# Patient Record
Sex: Female | Born: 1955
Health system: Southern US, Community
[De-identification: ages and names within clinical notes are randomized; demographics above are authoritative.]

## PROBLEM LIST (undated history)

## (undated) DIAGNOSIS — F419 Anxiety disorder, unspecified: Secondary | ICD-10-CM

## (undated) DIAGNOSIS — F319 Bipolar disorder, unspecified: Secondary | ICD-10-CM

## (undated) DIAGNOSIS — E119 Type 2 diabetes mellitus without complications: Secondary | ICD-10-CM

## (undated) DIAGNOSIS — Z860100 Personal history of colon polyps, unspecified: Secondary | ICD-10-CM

## (undated) DIAGNOSIS — Z8489 Family history of other specified conditions: Secondary | ICD-10-CM

## (undated) DIAGNOSIS — R0683 Snoring: Secondary | ICD-10-CM

## (undated) DIAGNOSIS — K589 Irritable bowel syndrome without diarrhea: Secondary | ICD-10-CM

## (undated) DIAGNOSIS — N6019 Diffuse cystic mastopathy of unspecified breast: Secondary | ICD-10-CM

## (undated) DIAGNOSIS — R0600 Dyspnea, unspecified: Secondary | ICD-10-CM

## (undated) DIAGNOSIS — T8859XA Other complications of anesthesia, initial encounter: Secondary | ICD-10-CM

## (undated) DIAGNOSIS — Z8619 Personal history of other infectious and parasitic diseases: Secondary | ICD-10-CM

## (undated) DIAGNOSIS — J449 Chronic obstructive pulmonary disease, unspecified: Secondary | ICD-10-CM

## (undated) DIAGNOSIS — E039 Hypothyroidism, unspecified: Secondary | ICD-10-CM

## (undated) DIAGNOSIS — F32A Depression, unspecified: Secondary | ICD-10-CM

## (undated) DIAGNOSIS — C801 Malignant (primary) neoplasm, unspecified: Secondary | ICD-10-CM

## (undated) DIAGNOSIS — F329 Major depressive disorder, single episode, unspecified: Secondary | ICD-10-CM

## (undated) DIAGNOSIS — E049 Nontoxic goiter, unspecified: Secondary | ICD-10-CM

## (undated) DIAGNOSIS — R32 Unspecified urinary incontinence: Secondary | ICD-10-CM

## (undated) DIAGNOSIS — Z8719 Personal history of other diseases of the digestive system: Secondary | ICD-10-CM

## (undated) DIAGNOSIS — M1990 Unspecified osteoarthritis, unspecified site: Secondary | ICD-10-CM

## (undated) DIAGNOSIS — E010 Iodine-deficiency related diffuse (endemic) goiter: Secondary | ICD-10-CM

## (undated) DIAGNOSIS — J45909 Unspecified asthma, uncomplicated: Secondary | ICD-10-CM

## (undated) DIAGNOSIS — K219 Gastro-esophageal reflux disease without esophagitis: Secondary | ICD-10-CM

## (undated) DIAGNOSIS — M92219 Osteochondrosis (juvenile) of carpal lunate [Kienbock], unspecified hand: Secondary | ICD-10-CM

## (undated) DIAGNOSIS — K5900 Constipation, unspecified: Secondary | ICD-10-CM

## (undated) HISTORY — PX: BREAST SURGERY: SHX581

## (undated) HISTORY — PX: TONSILLECTOMY: SUR1361

## (undated) HISTORY — PX: OTHER SURGICAL HISTORY: SHX169

## (undated) HISTORY — PX: TUBAL LIGATION: SHX77

## (undated) HISTORY — PX: SHOULDER ARTHROSCOPY: SHX128

---

## 2005-03-27 ENCOUNTER — Ambulatory Visit: Payer: Self-pay | Admitting: Unknown Physician Specialty

## 2005-05-06 ENCOUNTER — Encounter: Payer: Self-pay | Admitting: Unknown Physician Specialty

## 2005-06-01 ENCOUNTER — Encounter: Payer: Self-pay | Admitting: Unknown Physician Specialty

## 2005-07-02 ENCOUNTER — Encounter: Payer: Self-pay | Admitting: Unknown Physician Specialty

## 2005-07-30 ENCOUNTER — Encounter: Payer: Self-pay | Admitting: Unknown Physician Specialty

## 2006-10-21 ENCOUNTER — Encounter: Payer: Self-pay | Admitting: Urology

## 2006-10-31 ENCOUNTER — Encounter: Payer: Self-pay | Admitting: Urology

## 2006-11-11 ENCOUNTER — Ambulatory Visit: Payer: Self-pay | Admitting: Gastroenterology

## 2007-10-17 ENCOUNTER — Emergency Department: Payer: Self-pay | Admitting: Emergency Medicine

## 2008-06-25 ENCOUNTER — Ambulatory Visit: Payer: Self-pay | Admitting: Unknown Physician Specialty

## 2008-10-23 ENCOUNTER — Ambulatory Visit: Payer: Self-pay | Admitting: Unknown Physician Specialty

## 2008-10-31 ENCOUNTER — Ambulatory Visit: Payer: Self-pay | Admitting: Unknown Physician Specialty

## 2009-12-16 ENCOUNTER — Emergency Department: Payer: Self-pay | Admitting: Emergency Medicine

## 2010-01-17 ENCOUNTER — Emergency Department: Payer: Self-pay | Admitting: Emergency Medicine

## 2010-02-27 ENCOUNTER — Ambulatory Visit: Payer: Self-pay | Admitting: Obstetrics and Gynecology

## 2010-03-07 ENCOUNTER — Ambulatory Visit: Payer: Self-pay | Admitting: Obstetrics and Gynecology

## 2012-02-15 ENCOUNTER — Ambulatory Visit: Payer: Self-pay | Admitting: Gastroenterology

## 2012-02-24 ENCOUNTER — Ambulatory Visit: Payer: Self-pay | Admitting: Gastroenterology

## 2015-06-02 DIAGNOSIS — Z8614 Personal history of Methicillin resistant Staphylococcus aureus infection: Secondary | ICD-10-CM

## 2015-06-02 HISTORY — DX: Personal history of Methicillin resistant Staphylococcus aureus infection: Z86.14

## 2016-04-08 HISTORY — PX: JOINT REPLACEMENT: SHX530

## 2016-11-25 ENCOUNTER — Telehealth: Payer: Self-pay | Admitting: *Deleted

## 2016-11-25 DIAGNOSIS — Z87891 Personal history of nicotine dependence: Secondary | ICD-10-CM

## 2016-11-25 NOTE — Telephone Encounter (Signed)
Received referral for initial lung cancer screening scan. Contacted patient and obtained smoking history,(former, quit 12/31/14, 63 pack years) as well as answering questions related to screening process. Patient denies signs of lung cancer such as weight loss or hemoptysis. Patient denies comorbidity that would prevent curative treatment if lung cancer were found. Patient is scheduled for shared decision making visit and CT scan on 12/08/16.

## 2016-12-08 ENCOUNTER — Ambulatory Visit
Admission: RE | Admit: 2016-12-08 | Discharge: 2016-12-08 | Disposition: A | Discharge status: Home / Self Care | Payer: Medicare HMO | Source: Ambulatory Visit | Attending: Oncology | Admitting: Oncology

## 2016-12-08 ENCOUNTER — Encounter: Payer: Self-pay | Admitting: Oncology

## 2016-12-08 ENCOUNTER — Inpatient Hospital Stay: Payer: Medicare HMO | Attending: Oncology | Admitting: Oncology

## 2016-12-08 DIAGNOSIS — J438 Other emphysema: Secondary | ICD-10-CM | POA: Insufficient documentation

## 2016-12-08 DIAGNOSIS — J432 Centrilobular emphysema: Secondary | ICD-10-CM | POA: Diagnosis not present

## 2016-12-08 DIAGNOSIS — I700 Atherosclerosis of aorta: Secondary | ICD-10-CM | POA: Insufficient documentation

## 2016-12-08 DIAGNOSIS — Z87891 Personal history of nicotine dependence: Secondary | ICD-10-CM

## 2016-12-08 DIAGNOSIS — Z122 Encounter for screening for malignant neoplasm of respiratory organs: Secondary | ICD-10-CM | POA: Insufficient documentation

## 2016-12-08 NOTE — Progress Notes (Signed)
In accordance with CMS guidelines, patient has met eligibility criteria including age, absence of signs or symptoms of lung cancer.  Social History  Substance Use Topics  . Smoking status: Former Smoker    Packs/day: 1.50    Years: 42.00    Quit date: 12/31/2014  . Smokeless tobacco: Not on file  . Alcohol use Not on file     A shared decision-making session was conducted prior to the performance of CT scan. This includes one or more decision aids, includes benefits and harms of screening, follow-up diagnostic testing, over-diagnosis, false positive rate, and total radiation exposure.  Counseling on the importance of adherence to annual lung cancer LDCT screening, impact of co-morbidities, and ability or willingness to undergo diagnosis and treatment is imperative for compliance of the program.  Counseling on the importance of continued smoking cessation for former smokers; the importance of smoking cessation for current smokers, and information about tobacco cessation interventions have been given to patient including [employer] and [employer] quit [place] programs.  Written order for lung cancer screening with LDCT has been given to the patient and any and all questions have been answered to the best of my abilities.   Yearly follow up will be coordinated by Burgess Estelle, Thoracic Navigator.  Faythe Casa, NP 12/08/2016 1:19 PM

## 2016-12-10 ENCOUNTER — Telehealth: Payer: Self-pay | Admitting: *Deleted

## 2016-12-10 NOTE — Telephone Encounter (Signed)
Notified patient of LDCT lung cancer screening program results with recommendation for 12 month follow up imaging. Also notified of incidental findings noted below and is encouraged to discuss further with PCP who will receive a copy of this note and/or the CT report. Patient verbalizes understanding.   IMPRESSION: 1. Lung-RADS 2, benign appearance or behavior. Continue annual screening with low-dose chest CT without contrast in 12 months. 2. Mild diffuse bronchial wall thickening with mild centrilobular and paraseptal emphysema; imaging findings suggestive of underlying COPD. 3. Aortic atherosclerosis. Aortic Atherosclerosis (ICD10-I70.0) and Emphysema (ICD10-J43.9).

## 2016-12-15 ENCOUNTER — Other Ambulatory Visit: Payer: Self-pay | Admitting: Orthopaedic Surgery

## 2016-12-15 DIAGNOSIS — M19031 Primary osteoarthritis, right wrist: Secondary | ICD-10-CM

## 2016-12-19 ENCOUNTER — Ambulatory Visit: Payer: Medicare HMO

## 2017-01-04 ENCOUNTER — Ambulatory Visit
Admission: RE | Admit: 2017-01-04 | Discharge: 2017-01-04 | Disposition: A | Discharge status: Home / Self Care | Payer: Medicare HMO | Source: Ambulatory Visit | Attending: Orthopaedic Surgery | Admitting: Orthopaedic Surgery

## 2017-01-04 DIAGNOSIS — M19031 Primary osteoarthritis, right wrist: Secondary | ICD-10-CM | POA: Diagnosis present

## 2017-01-04 DIAGNOSIS — S63591A Other specified sprain of right wrist, initial encounter: Secondary | ICD-10-CM | POA: Diagnosis not present

## 2017-01-04 DIAGNOSIS — X58XXXA Exposure to other specified factors, initial encounter: Secondary | ICD-10-CM | POA: Insufficient documentation

## 2017-01-04 DIAGNOSIS — M659 Synovitis and tenosynovitis, unspecified: Secondary | ICD-10-CM | POA: Insufficient documentation

## 2017-01-04 MED ORDER — GADOBENATE DIMEGLUMINE 529 MG/ML IV SOLN
20.0000 mL | Freq: Once | INTRAVENOUS | Status: AC | PRN
  Administered 2017-01-04: 18 mL via INTRAVENOUS

## 2017-03-22 ENCOUNTER — Encounter: Payer: Self-pay | Admitting: *Deleted

## 2017-03-23 ENCOUNTER — Ambulatory Visit
Admission: RE | Admit: 2017-03-23 | Discharge: 2017-03-23 | Disposition: A | Discharge status: Home / Self Care | Payer: Medicare HMO | Source: Ambulatory Visit | Attending: Internal Medicine | Admitting: Internal Medicine

## 2017-03-23 ENCOUNTER — Encounter: Payer: Self-pay | Admitting: *Deleted

## 2017-03-23 ENCOUNTER — Encounter
Admission: RE | Disposition: A | Discharge status: Home / Self Care | Payer: Self-pay | Source: Ambulatory Visit | Attending: Internal Medicine

## 2017-03-23 ENCOUNTER — Ambulatory Visit: Payer: Medicare HMO | Admitting: Anesthesiology

## 2017-03-23 DIAGNOSIS — D128 Benign neoplasm of rectum: Secondary | ICD-10-CM | POA: Insufficient documentation

## 2017-03-23 DIAGNOSIS — Z87891 Personal history of nicotine dependence: Secondary | ICD-10-CM | POA: Diagnosis not present

## 2017-03-23 DIAGNOSIS — Z79899 Other long term (current) drug therapy: Secondary | ICD-10-CM | POA: Diagnosis not present

## 2017-03-23 DIAGNOSIS — Z1211 Encounter for screening for malignant neoplasm of colon: Secondary | ICD-10-CM | POA: Insufficient documentation

## 2017-03-23 DIAGNOSIS — M1990 Unspecified osteoarthritis, unspecified site: Secondary | ICD-10-CM | POA: Insufficient documentation

## 2017-03-23 DIAGNOSIS — J449 Chronic obstructive pulmonary disease, unspecified: Secondary | ICD-10-CM | POA: Insufficient documentation

## 2017-03-23 DIAGNOSIS — R1314 Dysphagia, pharyngoesophageal phase: Secondary | ICD-10-CM | POA: Diagnosis not present

## 2017-03-23 DIAGNOSIS — F319 Bipolar disorder, unspecified: Secondary | ICD-10-CM | POA: Diagnosis not present

## 2017-03-23 DIAGNOSIS — D125 Benign neoplasm of sigmoid colon: Secondary | ICD-10-CM | POA: Insufficient documentation

## 2017-03-23 DIAGNOSIS — K589 Irritable bowel syndrome without diarrhea: Secondary | ICD-10-CM | POA: Insufficient documentation

## 2017-03-23 DIAGNOSIS — K449 Diaphragmatic hernia without obstruction or gangrene: Secondary | ICD-10-CM | POA: Diagnosis not present

## 2017-03-23 DIAGNOSIS — F419 Anxiety disorder, unspecified: Secondary | ICD-10-CM | POA: Diagnosis not present

## 2017-03-23 DIAGNOSIS — Z86010 Personal history of colonic polyps: Secondary | ICD-10-CM | POA: Diagnosis not present

## 2017-03-23 DIAGNOSIS — Z6837 Body mass index (BMI) 37.0-37.9, adult: Secondary | ICD-10-CM | POA: Insufficient documentation

## 2017-03-23 DIAGNOSIS — K640 First degree hemorrhoids: Secondary | ICD-10-CM | POA: Insufficient documentation

## 2017-03-23 DIAGNOSIS — Z8619 Personal history of other infectious and parasitic diseases: Secondary | ICD-10-CM | POA: Diagnosis not present

## 2017-03-23 DIAGNOSIS — K219 Gastro-esophageal reflux disease without esophagitis: Secondary | ICD-10-CM | POA: Diagnosis not present

## 2017-03-23 HISTORY — DX: Diffuse cystic mastopathy of unspecified breast: N60.19

## 2017-03-23 HISTORY — DX: Personal history of colon polyps, unspecified: Z86.0100

## 2017-03-23 HISTORY — DX: Malignant (primary) neoplasm, unspecified: C80.1

## 2017-03-23 HISTORY — DX: Personal history of colonic polyps: Z86.010

## 2017-03-23 HISTORY — DX: Snoring: R06.83

## 2017-03-23 HISTORY — DX: Gastro-esophageal reflux disease without esophagitis: K21.9

## 2017-03-23 HISTORY — DX: Major depressive disorder, single episode, unspecified: F32.9

## 2017-03-23 HISTORY — PX: ESOPHAGOGASTRODUODENOSCOPY (EGD) WITH PROPOFOL: SHX5813

## 2017-03-23 HISTORY — DX: Osteochondrosis (juvenile) of carpal lunate (kienbock), unspecified hand: M92.219

## 2017-03-23 HISTORY — DX: Unspecified urinary incontinence: R32

## 2017-03-23 HISTORY — DX: Iodine-deficiency related diffuse (endemic) goiter: E01.0

## 2017-03-23 HISTORY — DX: Unspecified osteoarthritis, unspecified site: M19.90

## 2017-03-23 HISTORY — DX: Constipation, unspecified: K59.00

## 2017-03-23 HISTORY — DX: Nontoxic goiter, unspecified: E04.9

## 2017-03-23 HISTORY — DX: Bipolar disorder, unspecified: F31.9

## 2017-03-23 HISTORY — DX: Irritable bowel syndrome, unspecified: K58.9

## 2017-03-23 HISTORY — DX: Personal history of other infectious and parasitic diseases: Z86.19

## 2017-03-23 HISTORY — DX: Depression, unspecified: F32.A

## 2017-03-23 HISTORY — PX: COLONOSCOPY WITH PROPOFOL: SHX5780

## 2017-03-23 SURGERY — ESOPHAGOGASTRODUODENOSCOPY (EGD) WITH PROPOFOL
Anesthesia: General

## 2017-03-23 MED ORDER — SODIUM CHLORIDE 0.9 % IV SOLN
INTRAVENOUS | Status: DC
  Administered 2017-03-23 (×2): via INTRAVENOUS

## 2017-03-23 MED ORDER — IPRATROPIUM-ALBUTEROL 0.5-2.5 (3) MG/3ML IN SOLN: 3.0000 mL | Freq: Once | RESPIRATORY_TRACT | Status: DC

## 2017-03-23 MED ORDER — PROPOFOL 10 MG/ML IV BOLUS
INTRAVENOUS | Status: DC | PRN
  Administered 2017-03-23: 20 mg via INTRAVENOUS
  Administered 2017-03-23: 30 mg via INTRAVENOUS

## 2017-03-23 MED ORDER — PROPOFOL 500 MG/50ML IV EMUL
INTRAVENOUS | Status: DC | PRN
  Administered 2017-03-23: 175 ug/kg/min via INTRAVENOUS

## 2017-03-23 MED ORDER — LIDOCAINE HCL (CARDIAC) 20 MG/ML IV SOLN
INTRAVENOUS | Status: DC | PRN
  Administered 2017-03-23: 50 mg via INTRAVENOUS

## 2017-03-23 MED ORDER — PROPOFOL 500 MG/50ML IV EMUL
INTRAVENOUS | Status: AC
  Filled 2017-03-23: qty 50

## 2017-03-23 NOTE — H&P (Signed)
Outpatient short stay form Pre-procedure 03/23/2017 1:46 PM Monica Hill, M.D.  Primary Physician: Audelia Hives, PA-C  Reason for visit:  GERD, Dysphagia, colon cancer screening  History of present illness:  61 y/o female presents for chronic GERD with dysphagia to solids intermittently over the past several months. GERD symptoms appear controlled on Omeprazole 40mg /d. Patient also presents for colon cancer screening. She denies change in bowel habits, rectal bleeding, or weight loss.     Current Facility-Administered Medications:  .  0.9 %  sodium chloride infusion, , Intravenous, Continuous, Monica Hill, Monica Pike, Monica Hill, Last Rate: 20 mL/hr at 03/23/17 1338  Prescriptions Prior to Admission  Medication Sig Dispense Refill Last Dose  . albuterol (PROVENTIL HFA;VENTOLIN HFA) 108 (90 Base) MCG/ACT inhaler Inhale 2 puffs into the lungs every 6 (six) hours as needed for wheezing or shortness of breath.   Past Week at Unknown time  . dexamethasone (DECADRON) 1 MG tablet Take 1 mg by mouth 2 (two) times daily with a meal.   Past Week at Unknown time  . DPH-Lido-AlHydr-MgHydr-Simeth (FIRST-MOUTHWASH BLM MT) Use as directed 5 mLs in the mouth or throat 4 (four) times daily.     Marland Kitchen esomeprazole (NEXIUM) 40 MG capsule Take 40 mg by mouth daily at 12 noon.   03/23/2017 at Unknown time  . HYDROcodone-acetaminophen (NORCO/VICODIN) 5-325 MG tablet Take 1 tablet by mouth.   03/23/2017 at Unknown time  . lithium carbonate 300 MG capsule Take 300 mg by mouth 2 (two) times daily with a meal.   03/23/2017 at Unknown time  . PARoxetine (PAXIL) 30 MG tablet Take 30 mg by mouth daily.   03/23/2017 at Unknown time  . propranolol (INDERAL) 10 MG tablet Take 10 mg by mouth 4 (four) times daily.   Past Week at Unknown time  . QUEtiapine (SEROQUEL) 25 MG tablet Take 25 mg by mouth at bedtime.   03/22/2017 at Unknown time  . diazepam (VALIUM) 5 MG tablet Take 5 mg by mouth every 6 (six) hours as needed for anxiety.    Not Taking at Unknown time  . fluticasone furoate-vilanterol (BREO ELLIPTA) 100-25 MCG/INH AEPB Inhale 1 puff into the lungs daily.   Not Taking at Unknown time  . meloxicam (MOBIC) 15 MG tablet Take 15 mg by mouth daily.   Not Taking at Unknown time  . naproxen (NAPROSYN) 500 MG tablet Take 500 mg by mouth 2 (two) times daily with a meal.   Not Taking at Unknown time  . traMADol (ULTRAM) 50 MG tablet Take by mouth every 6 (six) hours as needed.   Not Taking at Unknown time     Not on File   Past Medical History:  Diagnosis Date  . Arthritis    OSTEOARTHRITIS  . Bipolar disorder ([place])   . Cancer (HCC)    BREAST  . Constipation   . Depression   . Fibrocystic breast disease   . GERD (gastroesophageal reflux disease)   . Goiter   . History of colon polyps   . History of shingles   . Irritable bowel disease   . Kienbock's disease   . Snores   . Thyromegaly   . Urine incontinence     Review of systems:      Physical Exam  General appearance: alert, cooperative and appears stated age Resp: clear to auscultation bilaterally Cardio: regular rate and rhythm, S1, S2 normal, no murmur, click, rub or gallop GI: soft, non-tender; bowel sounds normal; no masses,  no organomegaly  Extremities: extremities normal, atraumatic, no cyanosis or edema     Planned procedures: EGD and colonoscopy. The patient understands the nature of the planned procedure, indications, risks, alternatives and potential complications including but not limited to bleeding, infection, perforation, damage to internal organs and possible oversedation/side effects from anesthesia. The patient agrees and gives consent to proceed.  Please refer to procedure notes for findings, recommendations and patient disposition/instructions.    Monica Hill, M.D. Gastroenterology 03/23/2017  1:46 PM

## 2017-03-23 NOTE — Op Note (Signed)
[facility] Gastroenterology Patient Name: Monica Hill Procedure Date: 03/23/2017 1:38 PM MRN: [id number] Account #: [id number] Date of Birth: 06/10/1955 Admit Type: Outpatient Age: 61 Room: [facility] ENDO ROOM 4 Gender: Female Note Status: Finalized Procedure:            Colonoscopy Indications:          Screening for colorectal malignant neoplasm Providers:            Benay Pike. Alice Reichert MD, MD Referring MD:         Rusty Aus, MD (Referring MD) Medicines:            Propofol per Anesthesia Complications:        No immediate complications. Procedure:            Pre-Anesthesia Assessment:                       - ASA Grade Assessment: III - A patient with severe                        systemic disease.                       - After reviewing the risks and benefits, the patient                        was deemed in satisfactory condition to undergo the                        procedure.                       After obtaining informed consent, the colonoscope was                        passed under direct vision. Throughout the procedure,                        the patient's blood pressure, pulse, and oxygen                        saturations were monitored continuously. The                        Colonoscope was introduced through the anus and                        advanced to the the cecum, identified by appendiceal                        orifice and ileocecal valve. The colonoscopy was                        performed without difficulty. The patient tolerated the                        procedure well. The quality of the bowel preparation                        was good. The ileocecal valve, appendiceal orifice, and  rectum were photographed. The entire colon was well                        visualized. Findings:      The perianal and digital rectal examinations were normal. Pertinent       negatives include normal sphincter tone and no  palpable rectal lesions.      Two sessile polyps were found in the rectum and distal sigmoid colon.       The polyps were 2 to 3 mm in size. These polyps were removed with a       jumbo cold forceps. Resection and retrieval were complete.      A 3 mm polyp was found in the distal sigmoid colon. The polyp was       sessile. The polyp was removed with a jumbo cold forceps. Resection and       retrieval were complete.      Non-bleeding internal hemorrhoids were found during retroflexion. The       hemorrhoids were Grade I (internal hemorrhoids that do not prolapse). Impression:           - Two 2 to 3 mm polyps in the rectum and in the distal                        sigmoid colon, removed with a jumbo cold forceps.                        Resected and retrieved.                       - One 3 mm polyp in the distal sigmoid colon, removed                        with a jumbo cold forceps. Resected and retrieved.                       - Non-bleeding internal hemorrhoids. Recommendation:       - Patient has a contact number available for                        emergencies. The signs and symptoms of potential                        delayed complications were discussed with the patient.                        Return to normal activities tomorrow. Written discharge                        instructions were provided to the patient.                       - Resume previous diet.                       - Continue present medications.                       - Await pathology results.                       - Repeat colonoscopy  for surveillance based on                        pathology results.                       - Telephone GI clinic for pathology results in 1 week.                       - Return to GI office in 3 months.                       - The findings and recommendations were discussed with                        the patient.                       - The findings and recommendations were discussed  with                        the patient's family. Procedure Code(s):    --- Professional ---                       [phone], Colonoscopy, flexible; with biopsy, single or                        multiple Diagnosis Code(s):    --- Professional ---                       Z12.11, Encounter for screening for malignant neoplasm                        of colon                       K62.1, Rectal polyp                       D12.5, Benign neoplasm of sigmoid colon                       K64.0, First degree hemorrhoids CPT copyright 2016 American Medical Association. All rights reserved. The codes documented in this report are preliminary and upon coder review may  be revised to meet current compliance requirements. Efrain Sella MD, MD 03/23/2017 2:42:24 PM This report has been signed electronically. Number of Addenda: 0 Note Initiated On: 03/23/2017 1:38 PM Scope Withdrawal Time: 0 hours 15 minutes 52 seconds  Total Procedure Duration: 0 hours 27 minutes 41 seconds       [facility]

## 2017-03-23 NOTE — Anesthesia Preprocedure Evaluation (Signed)
Anesthesia Evaluation  Patient identified by MRN, date of birth, ID band Patient awake    Reviewed: Allergy & Precautions, NPO status , Patient's Chart, lab work & pertinent test results  Airway Mallampati: II       Dental  (+) Teeth Intact   Pulmonary COPD, former smoker,     + decreased breath sounds      Cardiovascular Exercise Tolerance: Good  Rhythm:Regular     Neuro/Psych Depression Bipolar Disorder    GI/Hepatic Neg liver ROS, GERD  Medicated,  Endo/Other  negative endocrine ROSMorbid obesity  Renal/GU negative Renal ROS     Musculoskeletal negative musculoskeletal ROS (+)   Abdominal (+) + obese,   Peds negative pediatric ROS (+)  Hematology   Anesthesia Other Findings   Reproductive/Obstetrics                             Anesthesia Physical Anesthesia Plan  ASA: III  Anesthesia Plan: General   Post-op Pain Management:    Induction: Intravenous  PONV Risk Score and Plan:   Airway Management Planned: Natural Airway and Nasal Cannula  Additional Equipment:   Intra-op Plan:   Post-operative Plan:   Informed Consent: I have reviewed the patients History and Physical, chart, labs and discussed the procedure including the risks, benefits and alternatives for the proposed anesthesia with the patient or authorized representative who has indicated his/her understanding and acceptance.     Plan Discussed with: CRNA  Anesthesia Plan Comments:         Anesthesia Quick Evaluation

## 2017-03-23 NOTE — Anesthesia Post-op Follow-up Note (Signed)
Anesthesia QCDR form completed.        

## 2017-03-23 NOTE — Interval H&P Note (Signed)
History and Physical Interval Note:  03/23/2017 1:49 PM  Monica Hill  has presented today for surgery, with the diagnosis of GERD SCREENING  The various methods of treatment have been discussed with the patient and family. After consideration of risks, benefits and other options for treatment, the patient has consented to  Procedure(s): ESOPHAGOGASTRODUODENOSCOPY (EGD) WITH PROPOFOL (N/A) COLONOSCOPY WITH PROPOFOL (N/A) as a surgical intervention .  The patient's history has been reviewed, patient examined, no change in status, stable for surgery.  I have reviewed the patient's chart and labs.  Questions were answered to the patient's satisfaction.     [place], [place]

## 2017-03-23 NOTE — Transfer of Care (Signed)
Immediate Anesthesia Transfer of Care Note  Patient: DLISA BARNWELL  Procedure(s) Performed: ESOPHAGOGASTRODUODENOSCOPY (EGD) WITH PROPOFOL (N/A ) COLONOSCOPY WITH PROPOFOL (N/A )  Patient Location: PACU  Anesthesia Type:General  Level of Consciousness: awake and alert   Airway & Oxygen Therapy: Patient Spontanous Breathing and Patient connected to nasal cannula oxygen  Post-op Assessment: Report given to RN and Post -op Vital signs reviewed and stable  Post vital signs: Reviewed and stable  Last Vitals:  Vitals:   03/23/17 1312 03/23/17 1438  BP: 140/76 115/61  Pulse: 85 80  Resp: 18 16  Temp: (!) 36.3 C 36.5 C  SpO2: 99% 97%    Last Pain:  Vitals:   03/23/17 1438  TempSrc: Temporal  PainSc:          Complications: No apparent anesthesia complications

## 2017-03-23 NOTE — Anesthesia Procedure Notes (Addendum)
Date/Time: 03/23/2017 1:51 PM Performed by: Johnna Acosta Pre-anesthesia Checklist: Patient identified, Emergency Drugs available, Suction available, Patient being monitored and Timeout performed Patient Re-evaluated:Patient Re-evaluated prior to induction Oxygen Delivery Method: Nasal cannula

## 2017-03-23 NOTE — Op Note (Signed)
[facility] Gastroenterology Patient Name: Monica Hill Procedure Date: 03/23/2017 1:34 PM MRN: [id number] Account #: [id number] Date of Birth: 02-15-1956 Admit Type: Outpatient Age: 61 Room: [facility] ENDO ROOM 4 Gender: Female Note Status: Finalized Procedure:            Upper GI endoscopy Indications:          Esophageal dysphagia, Esophageal reflux Providers:            Benay Pike. Alice Reichert MD, MD Referring MD:         Rusty Aus, MD (Referring MD) Medicines:            Propofol per Anesthesia Complications:        No immediate complications. Procedure:            Pre-Anesthesia Assessment:                       - The risks and benefits of the procedure and the                        sedation options and risks were discussed with the                        patient. All questions were answered and informed                        consent was obtained.                       - Patient identification and proposed procedure were                        verified prior to the procedure by the nurse.                       - ASA Grade Assessment: III - A patient with severe                        systemic disease.                       - After reviewing the risks and benefits, the patient                        was deemed in satisfactory condition to undergo the                        procedure.                       After obtaining informed consent, the endoscope was                        passed under direct vision. Throughout the procedure,                        the patient's blood pressure, pulse, and oxygen                        saturations were monitored continuously. The Endoscope  was introduced through the mouth, and advanced to the                        third part of duodenum. The upper GI endoscopy was                        accomplished without difficulty. The patient tolerated                        the procedure well. Findings:   Mucosal changes including feline appearance and longitudinal furrows       were found in the entire esophagus. Esophageal findings were graded       using the Eosinophilic Esophagitis Endoscopic Reference Score       (EoE-EREFS) as: Edema Grade 1 Present (decreased clarity or absence of       vascular markings), Rings Grade 2 Moderate (distinct rings that do not       occlude passage of diagnostic 8-10 mm endoscope) and Furrows Grade 1       Present (vertical lines with or without visible depth). Biopsies were       obtained from the proximal and distal esophagus with cold forceps for       histology of suspected eosinophilic esophagitis.      Abnormal motility was noted at the lower esophageal sphincter. The       distal esophagus/lower esophageal sphincter is spastic, but gives up       passage to the endoscope. The scope was withdrawn. Dilation was       performed with a Maloney dilator with no resistance at 58 Fr.      A small hiatal hernia was present.      The examined duodenum was normal. Impression:           - Esophageal mucosal changes suggestive of eosinophilic                        esophagitis. Biopsied.                       - Abnormal esophageal motility. Dilated.                       - Small hiatal hernia.                       - Normal examined duodenum. Recommendation:       - Patient has a contact number available for                        emergencies. The signs and symptoms of potential                        delayed complications were discussed with the patient.                        Return to normal activities tomorrow. Written discharge                        instructions were provided to the patient.                       - Resume previous diet.                       -  Continue present medications.                       - Await pathology results.                       - See the other procedure note for documentation of                        additional  recommendations. Procedure Code(s):    --- Professional ---                       [phone], Esophagogastroduodenoscopy, flexible, transoral;                        with biopsy, single or multiple                       43450, Dilation of esophagus, by unguided sound or                        bougie, single or multiple passes Diagnosis Code(s):    --- Professional ---                       K20.9, Esophagitis, unspecified                       K22.4, Dyskinesia of esophagus                       K44.9, Diaphragmatic hernia without obstruction or                        gangrene                       R13.14, Dysphagia, pharyngoesophageal phase                       K21.9, Gastro-esophageal reflux disease without                        esophagitis CPT copyright 2016 American Medical Association. All rights reserved. The codes documented in this report are preliminary and upon coder review may  be revised to meet current compliance requirements. Efrain Sella MD, MD 03/23/2017 2:46:03 PM This report has been signed electronically. Number of Addenda: 0 Note Initiated On: 03/23/2017 1:34 PM      [facility]

## 2017-03-24 ENCOUNTER — Encounter: Payer: Self-pay | Admitting: Internal Medicine

## 2017-03-24 NOTE — Anesthesia Postprocedure Evaluation (Signed)
Anesthesia Post Note  Patient: [name]  Procedure(s) Performed: ESOPHAGOGASTRODUODENOSCOPY (EGD) WITH PROPOFOL (N/A ) COLONOSCOPY WITH PROPOFOL (N/A )  Patient location during evaluation: PACU Anesthesia Type: General Level of consciousness: awake Pain management: pain level controlled Vital Signs Assessment: post-procedure vital signs reviewed and stable Respiratory status: spontaneous breathing Cardiovascular status: stable Anesthetic complications: no     Last Vitals:  Vitals:   03/23/17 1457 03/23/17 1507  BP: 126/68 140/72  Pulse: 71 71  Resp: 14 11  Temp:    SpO2: 100% 100%    Last Pain:  Vitals:   03/23/17 1438  TempSrc: Temporal  PainSc:                  VAN STAVEREN,Marcos Ruelas

## 2017-03-25 LAB — SURGICAL PATHOLOGY

## 2017-06-08 ENCOUNTER — Other Ambulatory Visit
Admission: RE | Admit: 2017-06-08 | Discharge: 2017-06-08 | Disposition: A | Discharge status: Home / Self Care | Payer: Medicare HMO | Source: Ambulatory Visit | Attending: Internal Medicine | Admitting: Internal Medicine

## 2017-06-08 DIAGNOSIS — E782 Mixed hyperlipidemia: Secondary | ICD-10-CM | POA: Diagnosis not present

## 2017-06-08 DIAGNOSIS — C50919 Malignant neoplasm of unspecified site of unspecified female breast: Secondary | ICD-10-CM | POA: Diagnosis not present

## 2017-06-08 DIAGNOSIS — E0590 Thyrotoxicosis, unspecified without thyrotoxic crisis or storm: Secondary | ICD-10-CM | POA: Diagnosis not present

## 2017-06-08 DIAGNOSIS — Z79899 Other long term (current) drug therapy: Secondary | ICD-10-CM | POA: Insufficient documentation

## 2017-06-08 DIAGNOSIS — R7303 Prediabetes: Secondary | ICD-10-CM | POA: Diagnosis not present

## 2017-06-08 DIAGNOSIS — Z0000 Encounter for general adult medical examination without abnormal findings: Secondary | ICD-10-CM | POA: Diagnosis not present

## 2017-06-08 DIAGNOSIS — N3946 Mixed incontinence: Secondary | ICD-10-CM | POA: Diagnosis not present

## 2017-06-08 LAB — LITHIUM LEVEL: LITHIUM LVL: 0.75 mmol/L (ref 0.60–1.20)

## 2017-06-09 DIAGNOSIS — M79641 Pain in right hand: Secondary | ICD-10-CM | POA: Diagnosis not present

## 2017-06-09 DIAGNOSIS — G894 Chronic pain syndrome: Secondary | ICD-10-CM | POA: Diagnosis not present

## 2017-06-14 DIAGNOSIS — N3946 Mixed incontinence: Secondary | ICD-10-CM | POA: Diagnosis not present

## 2017-06-14 DIAGNOSIS — E079 Disorder of thyroid, unspecified: Secondary | ICD-10-CM | POA: Diagnosis not present

## 2017-06-14 DIAGNOSIS — E782 Mixed hyperlipidemia: Secondary | ICD-10-CM | POA: Diagnosis not present

## 2017-06-14 DIAGNOSIS — F319 Bipolar disorder, unspecified: Secondary | ICD-10-CM | POA: Diagnosis not present

## 2017-06-14 DIAGNOSIS — J449 Chronic obstructive pulmonary disease, unspecified: Secondary | ICD-10-CM | POA: Diagnosis not present

## 2017-06-14 DIAGNOSIS — M19012 Primary osteoarthritis, left shoulder: Secondary | ICD-10-CM | POA: Diagnosis not present

## 2017-06-14 DIAGNOSIS — R7303 Prediabetes: Secondary | ICD-10-CM | POA: Diagnosis not present

## 2017-06-24 DIAGNOSIS — G894 Chronic pain syndrome: Secondary | ICD-10-CM | POA: Diagnosis not present

## 2017-06-24 DIAGNOSIS — M79641 Pain in right hand: Secondary | ICD-10-CM | POA: Diagnosis not present

## 2017-06-28 DIAGNOSIS — F3181 Bipolar II disorder: Secondary | ICD-10-CM | POA: Diagnosis not present

## 2017-07-13 DIAGNOSIS — J069 Acute upper respiratory infection, unspecified: Secondary | ICD-10-CM | POA: Diagnosis not present

## 2017-08-17 DIAGNOSIS — M931 Kienbock's disease of adults: Secondary | ICD-10-CM | POA: Diagnosis not present

## 2017-08-17 DIAGNOSIS — M25531 Pain in right wrist: Secondary | ICD-10-CM | POA: Diagnosis not present

## 2017-08-17 DIAGNOSIS — S63521D Sprain of radiocarpal joint of right wrist, subsequent encounter: Secondary | ICD-10-CM | POA: Diagnosis not present

## 2017-08-17 DIAGNOSIS — S63521S Sprain of radiocarpal joint of right wrist, sequela: Secondary | ICD-10-CM | POA: Diagnosis not present

## 2017-09-01 DIAGNOSIS — G894 Chronic pain syndrome: Secondary | ICD-10-CM | POA: Diagnosis not present

## 2017-09-24 DIAGNOSIS — F3181 Bipolar II disorder: Secondary | ICD-10-CM | POA: Diagnosis not present

## 2017-09-27 DIAGNOSIS — Z79899 Other long term (current) drug therapy: Secondary | ICD-10-CM | POA: Diagnosis not present

## 2017-11-04 DIAGNOSIS — M931 Kienbock's disease of adults: Secondary | ICD-10-CM | POA: Diagnosis not present

## 2017-11-04 DIAGNOSIS — S63521D Sprain of radiocarpal joint of right wrist, subsequent encounter: Secondary | ICD-10-CM | POA: Diagnosis not present

## 2017-11-07 DIAGNOSIS — L03211 Cellulitis of face: Secondary | ICD-10-CM | POA: Diagnosis not present

## 2017-11-11 DIAGNOSIS — L03211 Cellulitis of face: Secondary | ICD-10-CM | POA: Diagnosis not present

## 2017-11-11 DIAGNOSIS — L309 Dermatitis, unspecified: Secondary | ICD-10-CM | POA: Diagnosis not present

## 2017-12-01 DIAGNOSIS — M25512 Pain in left shoulder: Secondary | ICD-10-CM | POA: Diagnosis not present

## 2017-12-01 DIAGNOSIS — Z79899 Other long term (current) drug therapy: Secondary | ICD-10-CM | POA: Diagnosis not present

## 2017-12-03 ENCOUNTER — Telehealth: Payer: Self-pay | Admitting: *Deleted

## 2017-12-03 DIAGNOSIS — Z122 Encounter for screening for malignant neoplasm of respiratory organs: Secondary | ICD-10-CM

## 2017-12-03 DIAGNOSIS — Z87891 Personal history of nicotine dependence: Secondary | ICD-10-CM

## 2017-12-03 NOTE — Telephone Encounter (Signed)
Notified patient that annual lung cancer screening low dose CT scan is due currently or will be in near future. Confirmed that patient is within the age range of 55-77, and asymptomatic, (no signs or symptoms of lung cancer). Patient denies illness that would prevent curative treatment for lung cancer if found. Verified smoking history, (former, quit 12/31/14, 63 pack year). The shared decision making visit was done 12/08/16. Patient is agreeable for CT scan being scheduled.

## 2017-12-09 ENCOUNTER — Ambulatory Visit
Admission: RE | Admit: 2017-12-09 | Discharge: 2017-12-09 | Disposition: A | Discharge status: Home / Self Care | Payer: Medicare HMO | Source: Ambulatory Visit | Attending: Oncology | Admitting: Oncology

## 2017-12-09 DIAGNOSIS — K8020 Calculus of gallbladder without cholecystitis without obstruction: Secondary | ICD-10-CM | POA: Insufficient documentation

## 2017-12-09 DIAGNOSIS — Z87891 Personal history of nicotine dependence: Secondary | ICD-10-CM | POA: Diagnosis not present

## 2017-12-09 DIAGNOSIS — J439 Emphysema, unspecified: Secondary | ICD-10-CM | POA: Diagnosis not present

## 2017-12-09 DIAGNOSIS — Z122 Encounter for screening for malignant neoplasm of respiratory organs: Secondary | ICD-10-CM | POA: Diagnosis not present

## 2017-12-09 DIAGNOSIS — R300 Dysuria: Secondary | ICD-10-CM | POA: Diagnosis not present

## 2017-12-09 DIAGNOSIS — I700 Atherosclerosis of aorta: Secondary | ICD-10-CM | POA: Insufficient documentation

## 2017-12-13 ENCOUNTER — Encounter: Payer: Self-pay | Admitting: *Deleted

## 2017-12-13 DIAGNOSIS — N390 Urinary tract infection, site not specified: Secondary | ICD-10-CM | POA: Diagnosis not present

## 2017-12-13 DIAGNOSIS — R7303 Prediabetes: Secondary | ICD-10-CM | POA: Diagnosis not present

## 2017-12-13 DIAGNOSIS — J449 Chronic obstructive pulmonary disease, unspecified: Secondary | ICD-10-CM | POA: Diagnosis not present

## 2017-12-13 DIAGNOSIS — E079 Disorder of thyroid, unspecified: Secondary | ICD-10-CM | POA: Diagnosis not present

## 2017-12-13 DIAGNOSIS — E782 Mixed hyperlipidemia: Secondary | ICD-10-CM | POA: Diagnosis not present

## 2017-12-13 DIAGNOSIS — R319 Hematuria, unspecified: Secondary | ICD-10-CM | POA: Diagnosis not present

## 2017-12-14 DIAGNOSIS — E782 Mixed hyperlipidemia: Secondary | ICD-10-CM | POA: Diagnosis not present

## 2017-12-14 DIAGNOSIS — Z0000 Encounter for general adult medical examination without abnormal findings: Secondary | ICD-10-CM | POA: Diagnosis not present

## 2017-12-14 DIAGNOSIS — J449 Chronic obstructive pulmonary disease, unspecified: Secondary | ICD-10-CM | POA: Diagnosis not present

## 2017-12-14 DIAGNOSIS — Z79899 Other long term (current) drug therapy: Secondary | ICD-10-CM | POA: Diagnosis not present

## 2017-12-14 DIAGNOSIS — N390 Urinary tract infection, site not specified: Secondary | ICD-10-CM | POA: Diagnosis not present

## 2017-12-14 DIAGNOSIS — R7303 Prediabetes: Secondary | ICD-10-CM | POA: Diagnosis not present

## 2017-12-14 DIAGNOSIS — M19012 Primary osteoarthritis, left shoulder: Secondary | ICD-10-CM | POA: Diagnosis not present

## 2017-12-14 DIAGNOSIS — F319 Bipolar disorder, unspecified: Secondary | ICD-10-CM | POA: Diagnosis not present

## 2017-12-14 DIAGNOSIS — N3946 Mixed incontinence: Secondary | ICD-10-CM | POA: Diagnosis not present

## 2017-12-15 DIAGNOSIS — H524 Presbyopia: Secondary | ICD-10-CM | POA: Diagnosis not present

## 2018-01-05 DIAGNOSIS — F3181 Bipolar II disorder: Secondary | ICD-10-CM | POA: Diagnosis not present

## 2018-01-12 DIAGNOSIS — G894 Chronic pain syndrome: Secondary | ICD-10-CM | POA: Diagnosis not present

## 2018-01-13 DIAGNOSIS — M25531 Pain in right wrist: Secondary | ICD-10-CM | POA: Diagnosis not present

## 2018-01-19 DIAGNOSIS — Z96612 Presence of left artificial shoulder joint: Secondary | ICD-10-CM | POA: Diagnosis not present

## 2018-01-26 DIAGNOSIS — M25519 Pain in unspecified shoulder: Secondary | ICD-10-CM | POA: Diagnosis not present

## 2018-02-11 DIAGNOSIS — M25512 Pain in left shoulder: Secondary | ICD-10-CM | POA: Diagnosis not present

## 2018-02-11 DIAGNOSIS — G5682 Other specified mononeuropathies of left upper limb: Secondary | ICD-10-CM | POA: Diagnosis not present

## 2018-02-11 DIAGNOSIS — G5680 Other specified mononeuropathies of unspecified upper limb: Secondary | ICD-10-CM | POA: Diagnosis not present

## 2018-03-09 DIAGNOSIS — Z8614 Personal history of Methicillin resistant Staphylococcus aureus infection: Secondary | ICD-10-CM | POA: Diagnosis not present

## 2018-03-09 DIAGNOSIS — L03211 Cellulitis of face: Secondary | ICD-10-CM | POA: Diagnosis not present

## 2018-03-11 DIAGNOSIS — M25512 Pain in left shoulder: Secondary | ICD-10-CM | POA: Diagnosis not present

## 2018-03-23 DIAGNOSIS — G894 Chronic pain syndrome: Secondary | ICD-10-CM | POA: Diagnosis not present

## 2018-03-23 DIAGNOSIS — Z79899 Other long term (current) drug therapy: Secondary | ICD-10-CM | POA: Diagnosis not present

## 2018-03-29 DIAGNOSIS — L03211 Cellulitis of face: Secondary | ICD-10-CM | POA: Diagnosis not present

## 2018-04-06 DIAGNOSIS — J069 Acute upper respiratory infection, unspecified: Secondary | ICD-10-CM | POA: Diagnosis not present

## 2018-04-13 DIAGNOSIS — F3181 Bipolar II disorder: Secondary | ICD-10-CM | POA: Diagnosis not present

## 2018-04-19 DIAGNOSIS — J340 Abscess, furuncle and carbuncle of nose: Secondary | ICD-10-CM | POA: Diagnosis not present

## 2018-05-16 DIAGNOSIS — M25552 Pain in left hip: Secondary | ICD-10-CM | POA: Diagnosis not present

## 2018-05-16 DIAGNOSIS — G894 Chronic pain syndrome: Secondary | ICD-10-CM | POA: Diagnosis not present

## 2018-05-16 DIAGNOSIS — J340 Abscess, furuncle and carbuncle of nose: Secondary | ICD-10-CM | POA: Diagnosis not present

## 2018-05-16 DIAGNOSIS — M25519 Pain in unspecified shoulder: Secondary | ICD-10-CM | POA: Diagnosis not present

## 2018-06-06 DIAGNOSIS — S66114A Strain of flexor muscle, fascia and tendon of right ring finger at wrist and hand level, initial encounter: Secondary | ICD-10-CM | POA: Diagnosis not present

## 2018-06-10 DIAGNOSIS — N390 Urinary tract infection, site not specified: Secondary | ICD-10-CM | POA: Diagnosis not present

## 2018-06-10 DIAGNOSIS — E782 Mixed hyperlipidemia: Secondary | ICD-10-CM | POA: Diagnosis not present

## 2018-06-10 DIAGNOSIS — R7303 Prediabetes: Secondary | ICD-10-CM | POA: Diagnosis not present

## 2018-06-10 DIAGNOSIS — R319 Hematuria, unspecified: Secondary | ICD-10-CM | POA: Diagnosis not present

## 2018-06-10 DIAGNOSIS — Z79899 Other long term (current) drug therapy: Secondary | ICD-10-CM | POA: Diagnosis not present

## 2018-06-10 DIAGNOSIS — F319 Bipolar disorder, unspecified: Secondary | ICD-10-CM | POA: Diagnosis not present

## 2018-06-13 DIAGNOSIS — M25512 Pain in left shoulder: Secondary | ICD-10-CM | POA: Diagnosis not present

## 2018-06-14 DIAGNOSIS — R0789 Other chest pain: Secondary | ICD-10-CM | POA: Diagnosis not present

## 2018-06-14 DIAGNOSIS — Z79899 Other long term (current) drug therapy: Secondary | ICD-10-CM | POA: Diagnosis not present

## 2018-06-14 DIAGNOSIS — J449 Chronic obstructive pulmonary disease, unspecified: Secondary | ICD-10-CM | POA: Diagnosis not present

## 2018-06-14 DIAGNOSIS — M19012 Primary osteoarthritis, left shoulder: Secondary | ICD-10-CM | POA: Diagnosis not present

## 2018-06-14 DIAGNOSIS — N3946 Mixed incontinence: Secondary | ICD-10-CM | POA: Diagnosis not present

## 2018-06-14 DIAGNOSIS — R0602 Shortness of breath: Secondary | ICD-10-CM | POA: Diagnosis not present

## 2018-06-14 DIAGNOSIS — E782 Mixed hyperlipidemia: Secondary | ICD-10-CM | POA: Diagnosis not present

## 2018-06-14 DIAGNOSIS — F319 Bipolar disorder, unspecified: Secondary | ICD-10-CM | POA: Diagnosis not present

## 2018-06-14 DIAGNOSIS — R7303 Prediabetes: Secondary | ICD-10-CM | POA: Diagnosis not present

## 2018-06-21 ENCOUNTER — Other Ambulatory Visit: Payer: Self-pay | Admitting: Orthopedic Surgery

## 2018-06-21 DIAGNOSIS — M25512 Pain in left shoulder: Secondary | ICD-10-CM

## 2018-06-21 DIAGNOSIS — S62354D Nondisplaced fracture of shaft of fourth metacarpal bone, right hand, subsequent encounter for fracture with routine healing: Secondary | ICD-10-CM | POA: Diagnosis not present

## 2018-06-22 DIAGNOSIS — R0602 Shortness of breath: Secondary | ICD-10-CM | POA: Diagnosis not present

## 2018-06-22 DIAGNOSIS — R0789 Other chest pain: Secondary | ICD-10-CM | POA: Diagnosis not present

## 2018-06-30 ENCOUNTER — Ambulatory Visit
Admission: RE | Admit: 2018-06-30 | Discharge: 2018-06-30 | Disposition: A | Discharge status: Home / Self Care | Payer: Medicare HMO | Source: Ambulatory Visit | Attending: Orthopedic Surgery | Admitting: Orthopedic Surgery

## 2018-06-30 DIAGNOSIS — Z96612 Presence of left artificial shoulder joint: Secondary | ICD-10-CM | POA: Insufficient documentation

## 2018-06-30 DIAGNOSIS — I700 Atherosclerosis of aorta: Secondary | ICD-10-CM | POA: Diagnosis not present

## 2018-06-30 DIAGNOSIS — M25512 Pain in left shoulder: Secondary | ICD-10-CM

## 2018-06-30 DIAGNOSIS — M19012 Primary osteoarthritis, left shoulder: Secondary | ICD-10-CM | POA: Insufficient documentation

## 2018-06-30 DIAGNOSIS — J439 Emphysema, unspecified: Secondary | ICD-10-CM | POA: Insufficient documentation

## 2018-06-30 MED ORDER — LIDOCAINE HCL (PF) 1 % IJ SOLN
15.0000 mL | Freq: Once | INTRAMUSCULAR | Status: AC
  Administered 2018-06-30: 15 mL
  Filled 2018-06-30: qty 15

## 2018-06-30 MED ORDER — SODIUM CHLORIDE (PF) 0.9 % IJ SOLN
15.0000 mL | Freq: Once | INTRAMUSCULAR | Status: AC
  Administered 2018-06-30: 15 mL

## 2018-06-30 MED ORDER — IOPAMIDOL (ISOVUE-300) INJECTION 61%
15.0000 mL | Freq: Once | INTRAVENOUS | Status: AC | PRN
  Administered 2018-06-30: 15 mL

## 2018-07-04 DIAGNOSIS — Z96619 Presence of unspecified artificial shoulder joint: Secondary | ICD-10-CM | POA: Diagnosis not present

## 2018-07-04 DIAGNOSIS — M25511 Pain in right shoulder: Secondary | ICD-10-CM | POA: Diagnosis not present

## 2018-07-13 DIAGNOSIS — F3181 Bipolar II disorder: Secondary | ICD-10-CM | POA: Diagnosis not present

## 2018-07-18 DIAGNOSIS — Z79899 Other long term (current) drug therapy: Secondary | ICD-10-CM | POA: Diagnosis not present

## 2018-07-18 DIAGNOSIS — G894 Chronic pain syndrome: Secondary | ICD-10-CM | POA: Diagnosis not present

## 2018-07-18 DIAGNOSIS — Z5181 Encounter for therapeutic drug level monitoring: Secondary | ICD-10-CM | POA: Diagnosis not present

## 2018-08-11 DIAGNOSIS — L719 Rosacea, unspecified: Secondary | ICD-10-CM | POA: Diagnosis not present

## 2018-08-11 DIAGNOSIS — Z872 Personal history of diseases of the skin and subcutaneous tissue: Secondary | ICD-10-CM | POA: Diagnosis not present

## 2018-08-11 DIAGNOSIS — L309 Dermatitis, unspecified: Secondary | ICD-10-CM | POA: Diagnosis not present

## 2018-09-22 DIAGNOSIS — C50011 Malignant neoplasm of nipple and areola, right female breast: Secondary | ICD-10-CM | POA: Diagnosis not present

## 2018-09-22 DIAGNOSIS — R300 Dysuria: Secondary | ICD-10-CM | POA: Diagnosis not present

## 2018-09-22 DIAGNOSIS — N3090 Cystitis, unspecified without hematuria: Secondary | ICD-10-CM | POA: Diagnosis not present

## 2018-10-26 DIAGNOSIS — G894 Chronic pain syndrome: Secondary | ICD-10-CM | POA: Diagnosis not present

## 2018-10-26 DIAGNOSIS — Z79899 Other long term (current) drug therapy: Secondary | ICD-10-CM | POA: Diagnosis not present

## 2018-11-15 DIAGNOSIS — M654 Radial styloid tenosynovitis [de Quervain]: Secondary | ICD-10-CM | POA: Diagnosis not present

## 2018-11-29 IMAGING — CT CT CHEST LUNG CANCER SCREENING LOW DOSE W/O CM
2 of 5 series · 15 of 40 positions shown, 18 images · non-contrast
Comparison: 12/09/2014 screening chest CT.

CLINICAL DATA: 62-year-old asymptomatic female former smoker with
63 pack-year smoking history, quit smoking in 0361. History of left
breast cancer.

EXAM:
CT CHEST WITHOUT CONTRAST LOW-DOSE FOR LUNG CANCER SCREENING
TECHNIQUE: Multidetector CT imaging of the chest was performed following the
standard protocol without IV contrast.

[Series 3: lung · axial · 0.68mm/px · z∈[-984,-700]mm · 12 of 314 slices shown, 15 images (1 of 2)]
[im 15/314  mediastinal]
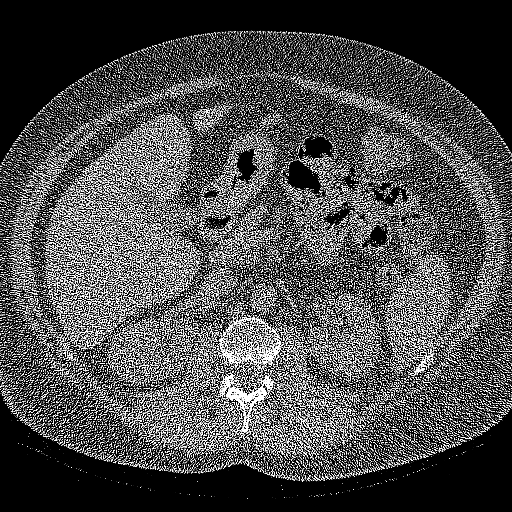
[im 15/314  lung]
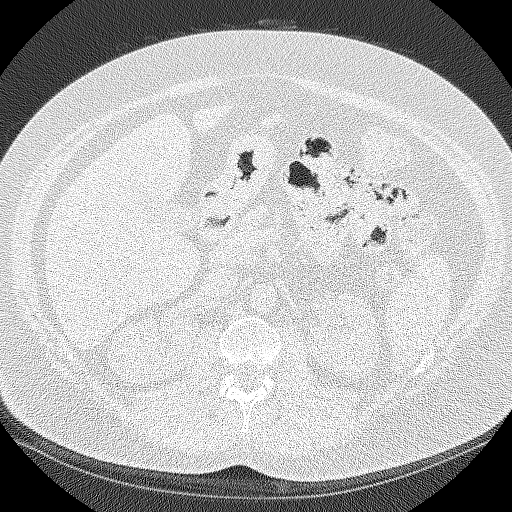
[im 45/314  lung]
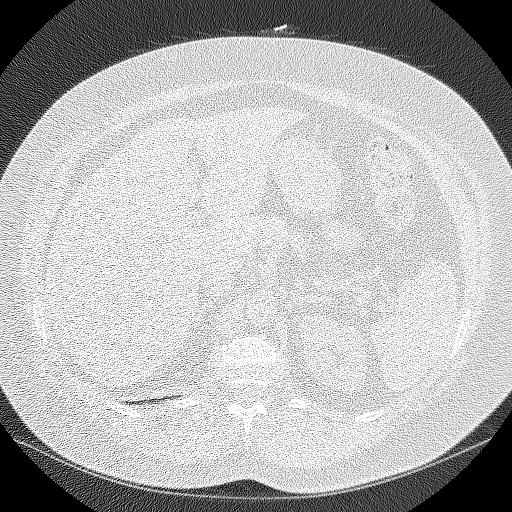
[im 75/314  lung]
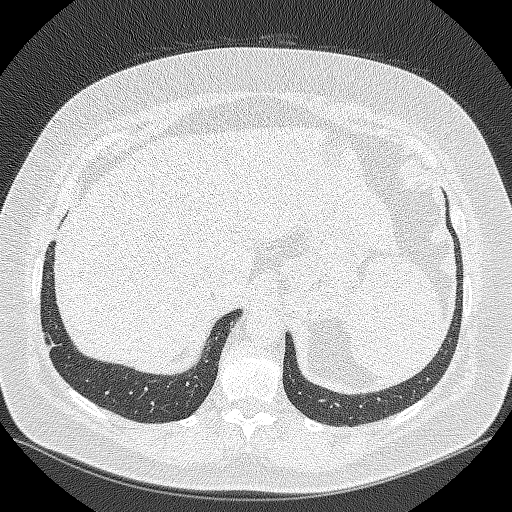
[im 90/314  lung]
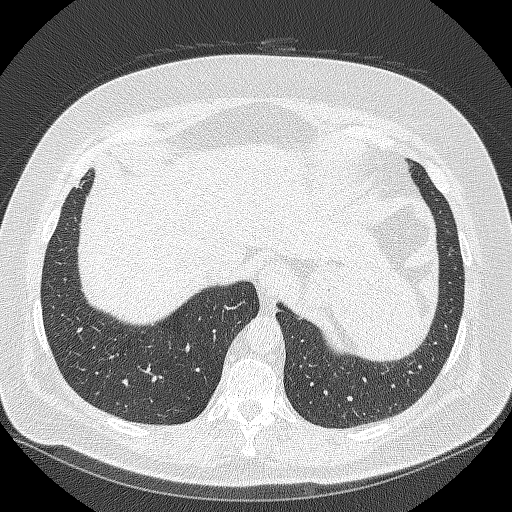
[im 120/314  mediastinal]
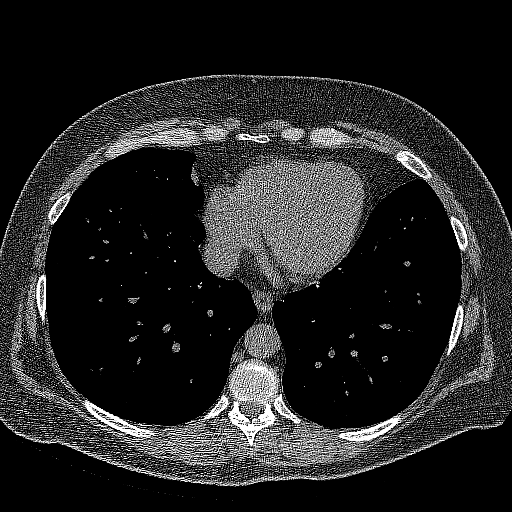
[im 120/314  lung]
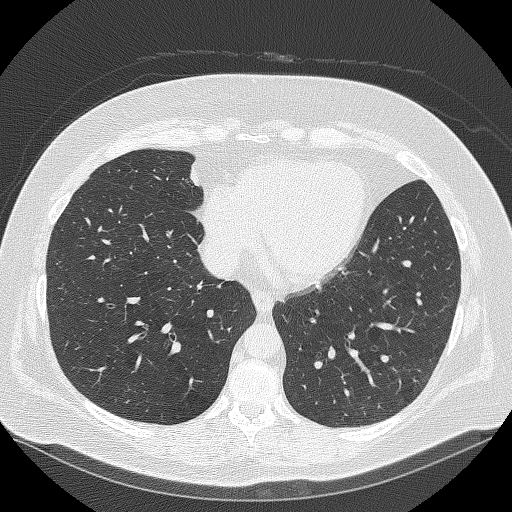
[im 150/314  lung]
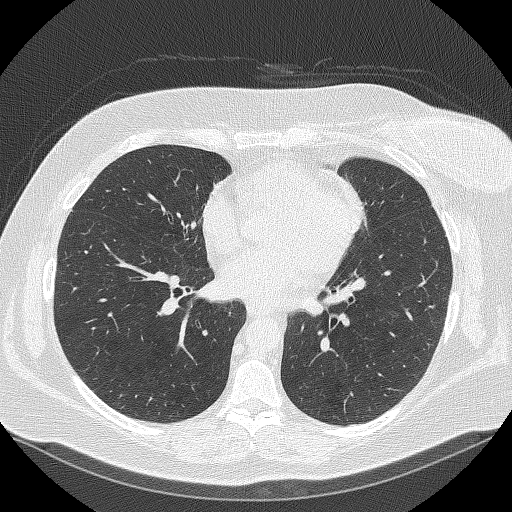
[im 164/314  lung]
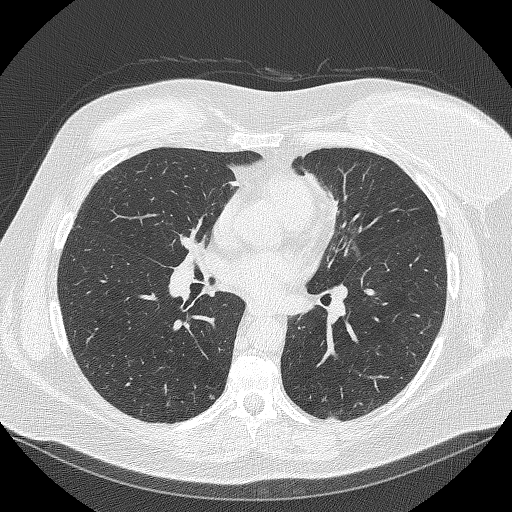
[im 194/314  lung]
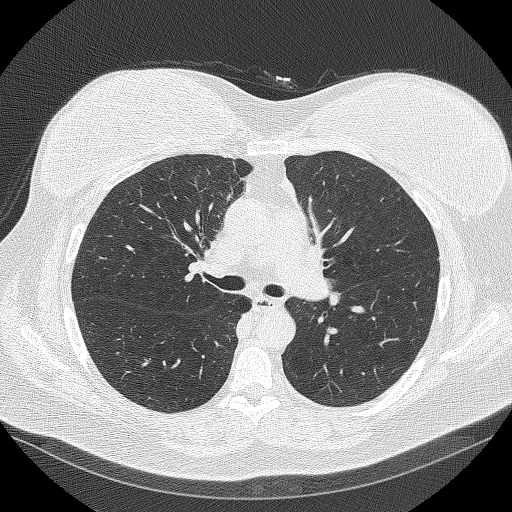
[im 224/314  mediastinal]
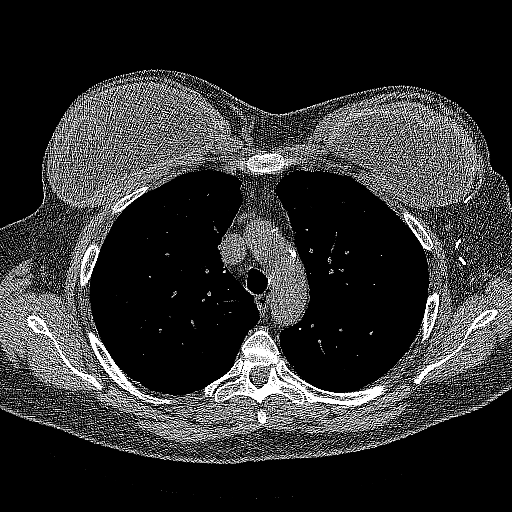
[im 224/314  lung]
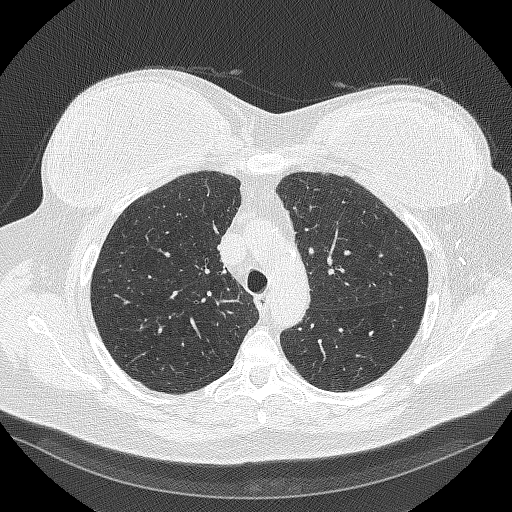
[im 239/314  lung]
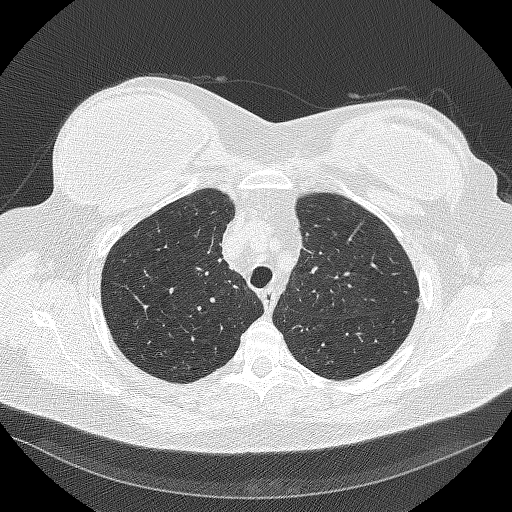
[im 269/314  lung]
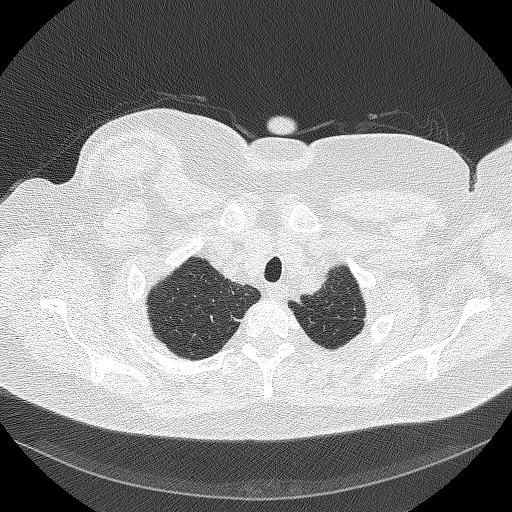
[im 299/314  lung]
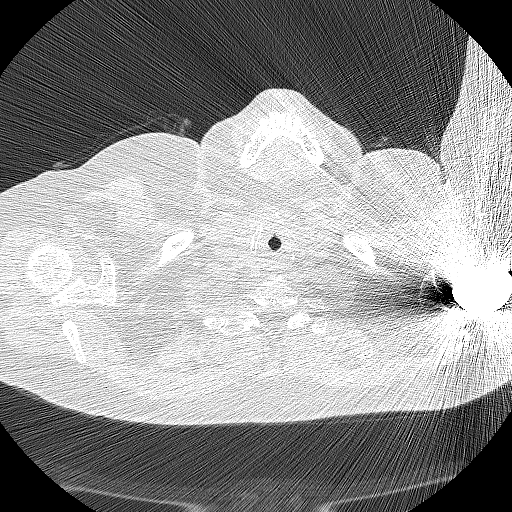

[Series 4: lung · coronal · 0.61mm/px · 3 of 312 slices shown (2 of 2)]
[im 63/312  lung]
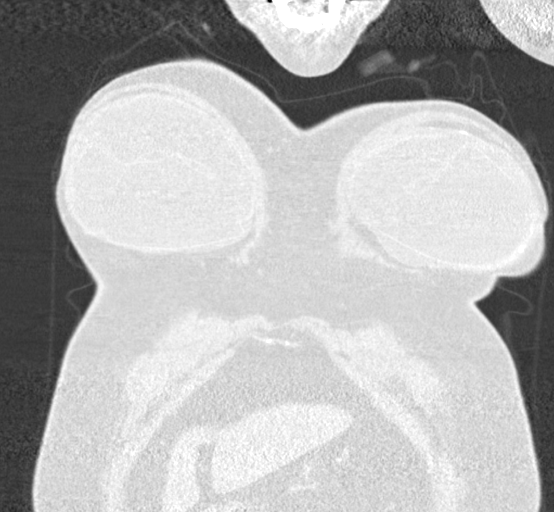
[im 125/312  lung]
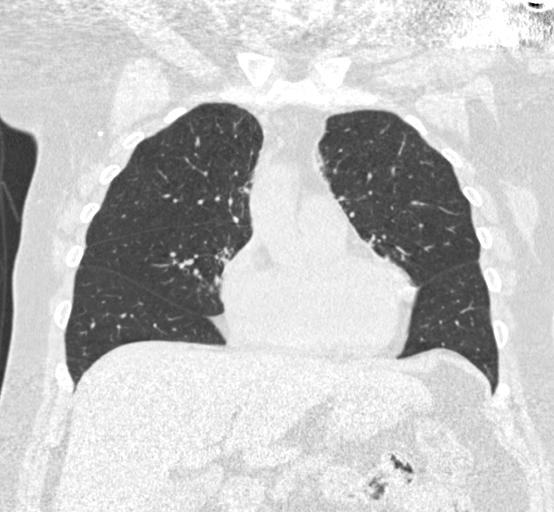
[im 187/312  lung]
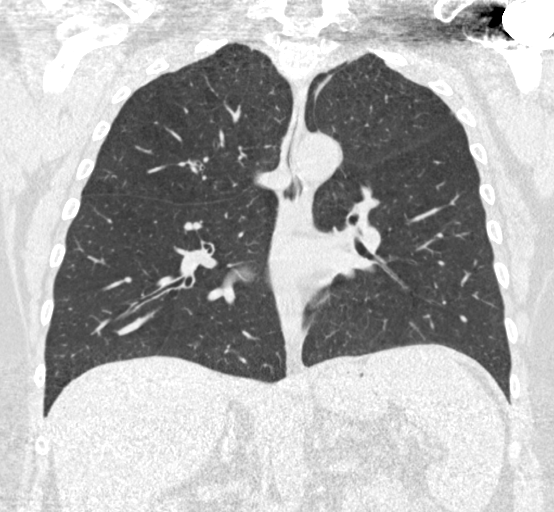

[15 of 40 positions shown; findings below may reference images not displayed]

FINDINGS: Cardiovascular: Normal heart size. No significant pericardial
effusion/thickening. Atherosclerotic nonaneurysmal thoracic aorta.
Normal caliber pulmonary arteries.

Mediastinum/Nodes: No discrete thyroid nodules. Unremarkable
esophagus. No pathologically enlarged axillary, mediastinal or hilar
lymph nodes, noting limited sensitivity for the detection of hilar
adenopathy on this noncontrast study.

Lungs/Pleura: No pneumothorax. No pleural effusion. Mild
centrilobular emphysema with mild diffuse bronchial wall thickening.
No acute consolidative airspace disease or lung masses. None of the
previously visualized scattered pulmonary nodules has demonstrated
significant interval growth. No new significant pulmonary nodules.

Upper abdomen: Cholelithiasis.

Musculoskeletal: No aggressive appearing focal osseous lesions.
Bilateral breast prostheses with evidence of intracapsular rupture
bilaterally. Mild thoracic spondylosis. Partially visualized left
shoulder arthroplasty.
IMPRESSION: 1. Lung-RADS 2, benign appearance or behavior. Continue annual
screening with low-dose chest CT without contrast in 12 months.
2. Cholelithiasis.

Aortic Atherosclerosis (M37CZ-[id number].D) and Emphysema ([id number]-FJP.B).

## 2018-12-12 ENCOUNTER — Telehealth: Payer: Self-pay | Admitting: *Deleted

## 2018-12-12 NOTE — Telephone Encounter (Signed)
Left message for patient to notify them that it is time to schedule annual low dose lung cancer screening CT scan. Instructed patient to call back to verify information prior to the scan being scheduled.  

## 2018-12-14 ENCOUNTER — Telehealth: Payer: Self-pay | Admitting: *Deleted

## 2018-12-14 DIAGNOSIS — Z122 Encounter for screening for malignant neoplasm of respiratory organs: Secondary | ICD-10-CM

## 2018-12-14 DIAGNOSIS — Z87891 Personal history of nicotine dependence: Secondary | ICD-10-CM

## 2018-12-14 NOTE — Telephone Encounter (Signed)
Patient has been notified that annual lung cancer screening low dose CT scan is due currently or will be in near future. Confirmed that patient is within the age range of 55-77, and asymptomatic, (no signs or symptoms of lung cancer). Patient denies illness that would prevent curative treatment for lung cancer if found. Verified smoking history, (former, quit 12/31/14, 63 pack year). The shared decision making visit was done 12/08/16. Patient is agreeable for CT scan being scheduled.

## 2018-12-19 ENCOUNTER — Ambulatory Visit
Admission: RE | Admit: 2018-12-19 | Discharge: 2018-12-19 | Disposition: A | Discharge status: Home / Self Care | Payer: Medicare HMO | Source: Ambulatory Visit | Attending: Oncology | Admitting: Oncology

## 2018-12-19 ENCOUNTER — Other Ambulatory Visit: Payer: Self-pay

## 2018-12-19 DIAGNOSIS — Z87891 Personal history of nicotine dependence: Secondary | ICD-10-CM | POA: Diagnosis not present

## 2018-12-19 DIAGNOSIS — Z122 Encounter for screening for malignant neoplasm of respiratory organs: Secondary | ICD-10-CM | POA: Diagnosis not present

## 2018-12-20 ENCOUNTER — Encounter: Payer: Self-pay | Admitting: *Deleted

## 2018-12-21 DIAGNOSIS — Z79899 Other long term (current) drug therapy: Secondary | ICD-10-CM | POA: Diagnosis not present

## 2018-12-21 DIAGNOSIS — Z0000 Encounter for general adult medical examination without abnormal findings: Secondary | ICD-10-CM | POA: Diagnosis not present

## 2018-12-21 DIAGNOSIS — E782 Mixed hyperlipidemia: Secondary | ICD-10-CM | POA: Diagnosis not present

## 2018-12-21 DIAGNOSIS — R7303 Prediabetes: Secondary | ICD-10-CM | POA: Diagnosis not present

## 2018-12-22 DIAGNOSIS — R300 Dysuria: Secondary | ICD-10-CM | POA: Diagnosis not present

## 2018-12-22 DIAGNOSIS — R8290 Unspecified abnormal findings in urine: Secondary | ICD-10-CM | POA: Diagnosis not present

## 2018-12-28 DIAGNOSIS — Z0000 Encounter for general adult medical examination without abnormal findings: Secondary | ICD-10-CM | POA: Diagnosis not present

## 2018-12-28 DIAGNOSIS — G894 Chronic pain syndrome: Secondary | ICD-10-CM | POA: Diagnosis not present

## 2018-12-28 DIAGNOSIS — R739 Hyperglycemia, unspecified: Secondary | ICD-10-CM | POA: Diagnosis not present

## 2018-12-28 DIAGNOSIS — F319 Bipolar disorder, unspecified: Secondary | ICD-10-CM | POA: Diagnosis not present

## 2018-12-28 DIAGNOSIS — E538 Deficiency of other specified B group vitamins: Secondary | ICD-10-CM | POA: Diagnosis not present

## 2018-12-28 DIAGNOSIS — J449 Chronic obstructive pulmonary disease, unspecified: Secondary | ICD-10-CM | POA: Diagnosis not present

## 2018-12-28 DIAGNOSIS — C50011 Malignant neoplasm of nipple and areola, right female breast: Secondary | ICD-10-CM | POA: Diagnosis not present

## 2018-12-28 DIAGNOSIS — Z79899 Other long term (current) drug therapy: Secondary | ICD-10-CM | POA: Diagnosis not present

## 2018-12-28 DIAGNOSIS — F3341 Major depressive disorder, recurrent, in partial remission: Secondary | ICD-10-CM | POA: Diagnosis not present

## 2018-12-28 DIAGNOSIS — M25512 Pain in left shoulder: Secondary | ICD-10-CM | POA: Diagnosis not present

## 2019-01-09 DIAGNOSIS — F3181 Bipolar II disorder: Secondary | ICD-10-CM | POA: Diagnosis not present

## 2019-01-20 DIAGNOSIS — G8918 Other acute postprocedural pain: Secondary | ICD-10-CM | POA: Diagnosis not present

## 2019-01-20 DIAGNOSIS — J441 Chronic obstructive pulmonary disease with (acute) exacerbation: Secondary | ICD-10-CM | POA: Diagnosis not present

## 2019-01-20 DIAGNOSIS — M24512 Contracture, left shoulder: Secondary | ICD-10-CM | POA: Diagnosis not present

## 2019-01-20 DIAGNOSIS — F17290 Nicotine dependence, other tobacco product, uncomplicated: Secondary | ICD-10-CM | POA: Diagnosis not present

## 2019-01-20 DIAGNOSIS — Z91048 Other nonmedicinal substance allergy status: Secondary | ICD-10-CM | POA: Diagnosis not present

## 2019-01-20 DIAGNOSIS — Z96612 Presence of left artificial shoulder joint: Secondary | ICD-10-CM | POA: Diagnosis not present

## 2019-01-20 DIAGNOSIS — F329 Major depressive disorder, single episode, unspecified: Secondary | ICD-10-CM | POA: Diagnosis not present

## 2019-01-20 DIAGNOSIS — Z96619 Presence of unspecified artificial shoulder joint: Secondary | ICD-10-CM | POA: Diagnosis not present

## 2019-01-20 DIAGNOSIS — Z96611 Presence of right artificial shoulder joint: Secondary | ICD-10-CM | POA: Diagnosis not present

## 2019-01-20 DIAGNOSIS — M25512 Pain in left shoulder: Secondary | ICD-10-CM | POA: Diagnosis not present

## 2019-01-20 DIAGNOSIS — J986 Disorders of diaphragm: Secondary | ICD-10-CM | POA: Diagnosis not present

## 2019-01-20 DIAGNOSIS — J449 Chronic obstructive pulmonary disease, unspecified: Secondary | ICD-10-CM | POA: Diagnosis not present

## 2019-01-20 DIAGNOSIS — M7522 Bicipital tendinitis, left shoulder: Secondary | ICD-10-CM | POA: Diagnosis not present

## 2019-01-20 DIAGNOSIS — M931 Kienbock's disease of adults: Secondary | ICD-10-CM | POA: Diagnosis not present

## 2019-01-20 DIAGNOSIS — K219 Gastro-esophageal reflux disease without esophagitis: Secondary | ICD-10-CM | POA: Diagnosis not present

## 2019-01-20 DIAGNOSIS — N3281 Overactive bladder: Secondary | ICD-10-CM | POA: Diagnosis not present

## 2019-01-20 DIAGNOSIS — M19012 Primary osteoarthritis, left shoulder: Secondary | ICD-10-CM | POA: Diagnosis not present

## 2019-01-24 DIAGNOSIS — J449 Chronic obstructive pulmonary disease, unspecified: Secondary | ICD-10-CM | POA: Diagnosis not present

## 2019-01-30 DIAGNOSIS — Z96612 Presence of left artificial shoulder joint: Secondary | ICD-10-CM | POA: Diagnosis not present

## 2019-01-30 DIAGNOSIS — M19012 Primary osteoarthritis, left shoulder: Secondary | ICD-10-CM | POA: Diagnosis not present

## 2019-02-02 DIAGNOSIS — M25612 Stiffness of left shoulder, not elsewhere classified: Secondary | ICD-10-CM | POA: Diagnosis not present

## 2019-02-02 DIAGNOSIS — R29898 Other symptoms and signs involving the musculoskeletal system: Secondary | ICD-10-CM | POA: Diagnosis not present

## 2019-02-02 DIAGNOSIS — Z96612 Presence of left artificial shoulder joint: Secondary | ICD-10-CM | POA: Diagnosis not present

## 2019-02-02 DIAGNOSIS — M25512 Pain in left shoulder: Secondary | ICD-10-CM | POA: Diagnosis not present

## 2019-02-09 DIAGNOSIS — Z96612 Presence of left artificial shoulder joint: Secondary | ICD-10-CM | POA: Diagnosis not present

## 2019-02-09 DIAGNOSIS — Z79899 Other long term (current) drug therapy: Secondary | ICD-10-CM | POA: Diagnosis not present

## 2019-02-09 DIAGNOSIS — M25512 Pain in left shoulder: Secondary | ICD-10-CM | POA: Diagnosis not present

## 2019-02-14 DIAGNOSIS — Z96612 Presence of left artificial shoulder joint: Secondary | ICD-10-CM | POA: Diagnosis not present

## 2019-02-17 DIAGNOSIS — M25512 Pain in left shoulder: Secondary | ICD-10-CM | POA: Diagnosis not present

## 2019-02-17 DIAGNOSIS — Z96612 Presence of left artificial shoulder joint: Secondary | ICD-10-CM | POA: Diagnosis not present

## 2019-02-20 DIAGNOSIS — M25512 Pain in left shoulder: Secondary | ICD-10-CM | POA: Diagnosis not present

## 2019-02-20 DIAGNOSIS — Z96612 Presence of left artificial shoulder joint: Secondary | ICD-10-CM | POA: Diagnosis not present

## 2019-02-23 DIAGNOSIS — M25512 Pain in left shoulder: Secondary | ICD-10-CM | POA: Diagnosis not present

## 2019-02-23 DIAGNOSIS — Z96612 Presence of left artificial shoulder joint: Secondary | ICD-10-CM | POA: Diagnosis not present

## 2019-02-24 DIAGNOSIS — J449 Chronic obstructive pulmonary disease, unspecified: Secondary | ICD-10-CM | POA: Diagnosis not present

## 2019-02-28 DIAGNOSIS — Z96612 Presence of left artificial shoulder joint: Secondary | ICD-10-CM | POA: Diagnosis not present

## 2019-02-28 DIAGNOSIS — M25512 Pain in left shoulder: Secondary | ICD-10-CM | POA: Diagnosis not present

## 2019-03-01 DIAGNOSIS — H524 Presbyopia: Secondary | ICD-10-CM | POA: Diagnosis not present

## 2019-03-03 DIAGNOSIS — Z96612 Presence of left artificial shoulder joint: Secondary | ICD-10-CM | POA: Diagnosis not present

## 2019-03-07 DIAGNOSIS — Z96612 Presence of left artificial shoulder joint: Secondary | ICD-10-CM | POA: Diagnosis not present

## 2019-03-07 DIAGNOSIS — M25512 Pain in left shoulder: Secondary | ICD-10-CM | POA: Diagnosis not present

## 2019-03-09 DIAGNOSIS — Z96612 Presence of left artificial shoulder joint: Secondary | ICD-10-CM | POA: Diagnosis not present

## 2019-03-09 DIAGNOSIS — M25512 Pain in left shoulder: Secondary | ICD-10-CM | POA: Diagnosis not present

## 2019-03-13 DIAGNOSIS — Z96612 Presence of left artificial shoulder joint: Secondary | ICD-10-CM | POA: Diagnosis not present

## 2019-03-14 DIAGNOSIS — M25512 Pain in left shoulder: Secondary | ICD-10-CM | POA: Diagnosis not present

## 2019-03-14 DIAGNOSIS — Z96612 Presence of left artificial shoulder joint: Secondary | ICD-10-CM | POA: Diagnosis not present

## 2019-03-16 DIAGNOSIS — M25512 Pain in left shoulder: Secondary | ICD-10-CM | POA: Diagnosis not present

## 2019-03-16 DIAGNOSIS — Z96612 Presence of left artificial shoulder joint: Secondary | ICD-10-CM | POA: Diagnosis not present

## 2019-03-21 DIAGNOSIS — Z96612 Presence of left artificial shoulder joint: Secondary | ICD-10-CM | POA: Diagnosis not present

## 2019-03-24 DIAGNOSIS — M25512 Pain in left shoulder: Secondary | ICD-10-CM | POA: Diagnosis not present

## 2019-03-24 DIAGNOSIS — Z96612 Presence of left artificial shoulder joint: Secondary | ICD-10-CM | POA: Diagnosis not present

## 2019-03-26 DIAGNOSIS — J449 Chronic obstructive pulmonary disease, unspecified: Secondary | ICD-10-CM | POA: Diagnosis not present

## 2019-03-27 DIAGNOSIS — H9201 Otalgia, right ear: Secondary | ICD-10-CM | POA: Diagnosis not present

## 2019-03-27 DIAGNOSIS — H6122 Impacted cerumen, left ear: Secondary | ICD-10-CM | POA: Diagnosis not present

## 2019-03-27 DIAGNOSIS — L299 Pruritus, unspecified: Secondary | ICD-10-CM | POA: Diagnosis not present

## 2019-03-28 DIAGNOSIS — M25512 Pain in left shoulder: Secondary | ICD-10-CM | POA: Diagnosis not present

## 2019-03-28 DIAGNOSIS — Z96612 Presence of left artificial shoulder joint: Secondary | ICD-10-CM | POA: Diagnosis not present

## 2019-03-30 DIAGNOSIS — M25512 Pain in left shoulder: Secondary | ICD-10-CM | POA: Diagnosis not present

## 2019-03-30 DIAGNOSIS — Z96612 Presence of left artificial shoulder joint: Secondary | ICD-10-CM | POA: Diagnosis not present

## 2019-04-04 DIAGNOSIS — Z96612 Presence of left artificial shoulder joint: Secondary | ICD-10-CM | POA: Diagnosis not present

## 2019-04-06 DIAGNOSIS — M25512 Pain in left shoulder: Secondary | ICD-10-CM | POA: Diagnosis not present

## 2019-04-06 DIAGNOSIS — Z96612 Presence of left artificial shoulder joint: Secondary | ICD-10-CM | POA: Diagnosis not present

## 2019-04-12 DIAGNOSIS — M25512 Pain in left shoulder: Secondary | ICD-10-CM | POA: Diagnosis not present

## 2019-04-12 DIAGNOSIS — Z96612 Presence of left artificial shoulder joint: Secondary | ICD-10-CM | POA: Diagnosis not present

## 2019-04-17 DIAGNOSIS — G894 Chronic pain syndrome: Secondary | ICD-10-CM | POA: Diagnosis not present

## 2019-04-17 DIAGNOSIS — M25512 Pain in left shoulder: Secondary | ICD-10-CM | POA: Diagnosis not present

## 2019-04-17 DIAGNOSIS — Z5181 Encounter for therapeutic drug level monitoring: Secondary | ICD-10-CM | POA: Diagnosis not present

## 2019-04-17 DIAGNOSIS — Z79899 Other long term (current) drug therapy: Secondary | ICD-10-CM | POA: Diagnosis not present

## 2019-04-18 DIAGNOSIS — Z96612 Presence of left artificial shoulder joint: Secondary | ICD-10-CM | POA: Diagnosis not present

## 2019-04-18 DIAGNOSIS — M25512 Pain in left shoulder: Secondary | ICD-10-CM | POA: Diagnosis not present

## 2019-04-20 DIAGNOSIS — M19031 Primary osteoarthritis, right wrist: Secondary | ICD-10-CM | POA: Diagnosis not present

## 2019-04-20 DIAGNOSIS — M189 Osteoarthritis of first carpometacarpal joint, unspecified: Secondary | ICD-10-CM | POA: Diagnosis not present

## 2019-04-25 DIAGNOSIS — Z96612 Presence of left artificial shoulder joint: Secondary | ICD-10-CM | POA: Diagnosis not present

## 2019-04-26 DIAGNOSIS — J449 Chronic obstructive pulmonary disease, unspecified: Secondary | ICD-10-CM | POA: Diagnosis not present

## 2019-05-03 DIAGNOSIS — R1012 Left upper quadrant pain: Secondary | ICD-10-CM | POA: Diagnosis not present

## 2019-05-03 DIAGNOSIS — R197 Diarrhea, unspecified: Secondary | ICD-10-CM | POA: Diagnosis not present

## 2019-05-03 DIAGNOSIS — R110 Nausea: Secondary | ICD-10-CM | POA: Diagnosis not present

## 2019-05-03 DIAGNOSIS — Z87891 Personal history of nicotine dependence: Secondary | ICD-10-CM | POA: Diagnosis not present

## 2019-05-04 ENCOUNTER — Other Ambulatory Visit: Payer: Self-pay | Admitting: Infectious Diseases

## 2019-05-04 DIAGNOSIS — R1012 Left upper quadrant pain: Secondary | ICD-10-CM

## 2019-05-04 DIAGNOSIS — R110 Nausea: Secondary | ICD-10-CM

## 2019-05-04 DIAGNOSIS — R7989 Other specified abnormal findings of blood chemistry: Secondary | ICD-10-CM

## 2019-05-05 ENCOUNTER — Ambulatory Visit
Admission: RE | Admit: 2019-05-05 | Discharge: 2019-05-05 | Disposition: A | Discharge status: Home / Self Care | Payer: Medicare HMO | Source: Ambulatory Visit | Attending: Infectious Diseases | Admitting: Infectious Diseases

## 2019-05-05 ENCOUNTER — Other Ambulatory Visit: Payer: Self-pay

## 2019-05-05 DIAGNOSIS — R1012 Left upper quadrant pain: Secondary | ICD-10-CM

## 2019-05-05 DIAGNOSIS — R7989 Other specified abnormal findings of blood chemistry: Secondary | ICD-10-CM | POA: Insufficient documentation

## 2019-05-05 DIAGNOSIS — R110 Nausea: Secondary | ICD-10-CM | POA: Diagnosis not present

## 2019-05-05 DIAGNOSIS — K8020 Calculus of gallbladder without cholecystitis without obstruction: Secondary | ICD-10-CM | POA: Diagnosis not present

## 2019-05-08 DIAGNOSIS — M25512 Pain in left shoulder: Secondary | ICD-10-CM | POA: Diagnosis not present

## 2019-05-08 DIAGNOSIS — M542 Cervicalgia: Secondary | ICD-10-CM | POA: Diagnosis not present

## 2019-05-10 DIAGNOSIS — F3181 Bipolar II disorder: Secondary | ICD-10-CM | POA: Diagnosis not present

## 2019-05-12 DIAGNOSIS — Z96612 Presence of left artificial shoulder joint: Secondary | ICD-10-CM | POA: Diagnosis not present

## 2019-05-12 DIAGNOSIS — M25512 Pain in left shoulder: Secondary | ICD-10-CM | POA: Diagnosis not present

## 2019-05-15 DIAGNOSIS — E669 Obesity, unspecified: Secondary | ICD-10-CM | POA: Diagnosis not present

## 2019-05-15 DIAGNOSIS — K2951 Unspecified chronic gastritis with bleeding: Secondary | ICD-10-CM | POA: Diagnosis not present

## 2019-05-15 DIAGNOSIS — K279 Peptic ulcer, site unspecified, unspecified as acute or chronic, without hemorrhage or perforation: Secondary | ICD-10-CM | POA: Diagnosis not present

## 2019-05-15 DIAGNOSIS — F319 Bipolar disorder, unspecified: Secondary | ICD-10-CM | POA: Diagnosis not present

## 2019-05-19 DIAGNOSIS — Z96612 Presence of left artificial shoulder joint: Secondary | ICD-10-CM | POA: Diagnosis not present

## 2019-05-19 DIAGNOSIS — M25512 Pain in left shoulder: Secondary | ICD-10-CM | POA: Diagnosis not present

## 2019-05-26 DIAGNOSIS — J449 Chronic obstructive pulmonary disease, unspecified: Secondary | ICD-10-CM | POA: Diagnosis not present

## 2019-05-30 DIAGNOSIS — M25512 Pain in left shoulder: Secondary | ICD-10-CM | POA: Diagnosis not present

## 2019-05-30 DIAGNOSIS — Z96612 Presence of left artificial shoulder joint: Secondary | ICD-10-CM | POA: Diagnosis not present

## 2019-06-01 DIAGNOSIS — M25512 Pain in left shoulder: Secondary | ICD-10-CM | POA: Diagnosis not present

## 2019-06-01 DIAGNOSIS — Z96612 Presence of left artificial shoulder joint: Secondary | ICD-10-CM | POA: Diagnosis not present

## 2019-06-06 DIAGNOSIS — Z96612 Presence of left artificial shoulder joint: Secondary | ICD-10-CM | POA: Diagnosis not present

## 2019-06-06 DIAGNOSIS — M25512 Pain in left shoulder: Secondary | ICD-10-CM | POA: Diagnosis not present

## 2019-06-09 DIAGNOSIS — Z96612 Presence of left artificial shoulder joint: Secondary | ICD-10-CM | POA: Diagnosis not present

## 2019-06-13 DIAGNOSIS — Z96612 Presence of left artificial shoulder joint: Secondary | ICD-10-CM | POA: Diagnosis not present

## 2019-06-13 DIAGNOSIS — M25512 Pain in left shoulder: Secondary | ICD-10-CM | POA: Diagnosis not present

## 2019-06-15 DIAGNOSIS — Z96612 Presence of left artificial shoulder joint: Secondary | ICD-10-CM | POA: Diagnosis not present

## 2019-06-15 DIAGNOSIS — M25512 Pain in left shoulder: Secondary | ICD-10-CM | POA: Diagnosis not present

## 2019-06-19 DIAGNOSIS — Z96612 Presence of left artificial shoulder joint: Secondary | ICD-10-CM | POA: Diagnosis not present

## 2019-06-22 DIAGNOSIS — Z96612 Presence of left artificial shoulder joint: Secondary | ICD-10-CM | POA: Diagnosis not present

## 2019-06-22 DIAGNOSIS — M25512 Pain in left shoulder: Secondary | ICD-10-CM | POA: Diagnosis not present

## 2019-06-26 DIAGNOSIS — J449 Chronic obstructive pulmonary disease, unspecified: Secondary | ICD-10-CM | POA: Diagnosis not present

## 2019-06-27 ENCOUNTER — Other Ambulatory Visit: Payer: Self-pay | Admitting: Orthopedic Surgery

## 2019-06-27 DIAGNOSIS — Z96612 Presence of left artificial shoulder joint: Secondary | ICD-10-CM

## 2019-07-05 DIAGNOSIS — B349 Viral infection, unspecified: Secondary | ICD-10-CM | POA: Diagnosis not present

## 2019-07-10 DIAGNOSIS — Z20822 Contact with and (suspected) exposure to covid-19: Secondary | ICD-10-CM | POA: Diagnosis not present

## 2019-07-10 DIAGNOSIS — Z87891 Personal history of nicotine dependence: Secondary | ICD-10-CM | POA: Diagnosis not present

## 2019-07-10 DIAGNOSIS — F319 Bipolar disorder, unspecified: Secondary | ICD-10-CM | POA: Diagnosis not present

## 2019-07-10 DIAGNOSIS — K219 Gastro-esophageal reflux disease without esophagitis: Secondary | ICD-10-CM | POA: Diagnosis not present

## 2019-07-10 DIAGNOSIS — Z08 Encounter for follow-up examination after completed treatment for malignant neoplasm: Secondary | ICD-10-CM | POA: Diagnosis not present

## 2019-07-10 DIAGNOSIS — J449 Chronic obstructive pulmonary disease, unspecified: Secondary | ICD-10-CM | POA: Diagnosis not present

## 2019-07-12 ENCOUNTER — Ambulatory Visit: Payer: Medicare HMO

## 2019-07-17 DIAGNOSIS — G8929 Other chronic pain: Secondary | ICD-10-CM | POA: Diagnosis not present

## 2019-07-17 DIAGNOSIS — Z79899 Other long term (current) drug therapy: Secondary | ICD-10-CM | POA: Diagnosis not present

## 2019-07-17 DIAGNOSIS — M25512 Pain in left shoulder: Secondary | ICD-10-CM | POA: Diagnosis not present

## 2019-07-17 DIAGNOSIS — M179 Osteoarthritis of knee, unspecified: Secondary | ICD-10-CM | POA: Diagnosis not present

## 2019-07-17 DIAGNOSIS — M25519 Pain in unspecified shoulder: Secondary | ICD-10-CM | POA: Diagnosis not present

## 2019-07-17 DIAGNOSIS — G894 Chronic pain syndrome: Secondary | ICD-10-CM | POA: Diagnosis not present

## 2019-07-26 ENCOUNTER — Ambulatory Visit
Admission: RE | Admit: 2019-07-26 | Discharge: 2019-07-26 | Disposition: A | Discharge status: Home / Self Care | Payer: Medicare HMO | Source: Ambulatory Visit | Attending: Orthopedic Surgery | Admitting: Orthopedic Surgery

## 2019-07-26 ENCOUNTER — Other Ambulatory Visit: Payer: Self-pay

## 2019-07-26 DIAGNOSIS — Z96612 Presence of left artificial shoulder joint: Secondary | ICD-10-CM | POA: Diagnosis not present

## 2019-07-26 DIAGNOSIS — Z79899 Other long term (current) drug therapy: Secondary | ICD-10-CM | POA: Diagnosis not present

## 2019-07-26 DIAGNOSIS — M25512 Pain in left shoulder: Secondary | ICD-10-CM | POA: Diagnosis not present

## 2019-07-26 MED ORDER — LIDOCAINE HCL (PF) 1 % IJ SOLN
5.0000 mL | Freq: Once | INTRAMUSCULAR | Status: AC
  Administered 2019-07-26: 10:00:00 5 mL

## 2019-07-26 MED ORDER — SODIUM CHLORIDE (PF) 0.9 % IJ SOLN
7.0000 mL | Freq: Once | INTRAMUSCULAR | Status: AC
  Administered 2019-07-26: 10:00:00 7 mL

## 2019-07-26 MED ORDER — IOHEXOL 180 MG/ML  SOLN
10.0000 mL | Freq: Once | INTRAMUSCULAR | Status: AC | PRN
  Administered 2019-07-26: 13 mL via INTRA_ARTICULAR

## 2019-07-27 DIAGNOSIS — J449 Chronic obstructive pulmonary disease, unspecified: Secondary | ICD-10-CM | POA: Diagnosis not present

## 2019-08-02 DIAGNOSIS — F3181 Bipolar II disorder: Secondary | ICD-10-CM | POA: Diagnosis not present

## 2019-08-07 DIAGNOSIS — M25512 Pain in left shoulder: Secondary | ICD-10-CM | POA: Diagnosis not present

## 2019-08-24 DIAGNOSIS — J449 Chronic obstructive pulmonary disease, unspecified: Secondary | ICD-10-CM | POA: Diagnosis not present

## 2019-09-05 ENCOUNTER — Other Ambulatory Visit: Payer: Self-pay | Admitting: Gastroenterology

## 2019-09-05 DIAGNOSIS — R1013 Epigastric pain: Secondary | ICD-10-CM | POA: Diagnosis not present

## 2019-09-05 DIAGNOSIS — R1012 Left upper quadrant pain: Secondary | ICD-10-CM

## 2019-09-05 DIAGNOSIS — F319 Bipolar disorder, unspecified: Secondary | ICD-10-CM | POA: Diagnosis not present

## 2019-09-05 DIAGNOSIS — J449 Chronic obstructive pulmonary disease, unspecified: Secondary | ICD-10-CM | POA: Diagnosis not present

## 2019-09-05 DIAGNOSIS — R1310 Dysphagia, unspecified: Secondary | ICD-10-CM

## 2019-09-05 DIAGNOSIS — R140 Abdominal distension (gaseous): Secondary | ICD-10-CM

## 2019-09-05 DIAGNOSIS — R110 Nausea: Secondary | ICD-10-CM

## 2019-09-05 DIAGNOSIS — K224 Dyskinesia of esophagus: Secondary | ICD-10-CM | POA: Diagnosis not present

## 2019-09-05 DIAGNOSIS — Z86010 Personal history of colonic polyps: Secondary | ICD-10-CM | POA: Diagnosis not present

## 2019-09-06 ENCOUNTER — Other Ambulatory Visit: Payer: Self-pay | Admitting: Gastroenterology

## 2019-09-06 DIAGNOSIS — R110 Nausea: Secondary | ICD-10-CM

## 2019-09-06 DIAGNOSIS — R140 Abdominal distension (gaseous): Secondary | ICD-10-CM

## 2019-09-06 DIAGNOSIS — R1310 Dysphagia, unspecified: Secondary | ICD-10-CM

## 2019-09-06 DIAGNOSIS — R1012 Left upper quadrant pain: Secondary | ICD-10-CM

## 2019-09-06 DIAGNOSIS — R1013 Epigastric pain: Secondary | ICD-10-CM

## 2019-09-12 ENCOUNTER — Other Ambulatory Visit: Payer: Self-pay

## 2019-09-12 ENCOUNTER — Ambulatory Visit
Admission: RE | Admit: 2019-09-12 | Discharge: 2019-09-12 | Disposition: A | Discharge status: Home / Self Care | Payer: Medicare HMO | Source: Ambulatory Visit | Attending: Gastroenterology | Admitting: Gastroenterology

## 2019-09-12 ENCOUNTER — Ambulatory Visit: Admission: RE | Admit: 2019-09-12 | Payer: Medicare HMO | Source: Ambulatory Visit

## 2019-09-12 DIAGNOSIS — R1013 Epigastric pain: Secondary | ICD-10-CM | POA: Diagnosis not present

## 2019-09-12 DIAGNOSIS — R1310 Dysphagia, unspecified: Secondary | ICD-10-CM

## 2019-09-12 DIAGNOSIS — R140 Abdominal distension (gaseous): Secondary | ICD-10-CM

## 2019-09-12 DIAGNOSIS — R110 Nausea: Secondary | ICD-10-CM | POA: Diagnosis not present

## 2019-09-12 DIAGNOSIS — R1012 Left upper quadrant pain: Secondary | ICD-10-CM | POA: Diagnosis not present

## 2019-09-24 DIAGNOSIS — J449 Chronic obstructive pulmonary disease, unspecified: Secondary | ICD-10-CM | POA: Diagnosis not present

## 2019-10-31 DIAGNOSIS — M65321 Trigger finger, right index finger: Secondary | ICD-10-CM | POA: Diagnosis not present

## 2019-10-31 DIAGNOSIS — M65331 Trigger finger, right middle finger: Secondary | ICD-10-CM | POA: Diagnosis not present

## 2019-11-01 DIAGNOSIS — Z79899 Other long term (current) drug therapy: Secondary | ICD-10-CM | POA: Diagnosis not present

## 2019-11-01 DIAGNOSIS — Z5181 Encounter for therapeutic drug level monitoring: Secondary | ICD-10-CM | POA: Diagnosis not present

## 2019-11-01 DIAGNOSIS — M25519 Pain in unspecified shoulder: Secondary | ICD-10-CM | POA: Diagnosis not present

## 2019-11-13 DIAGNOSIS — S8012XA Contusion of left lower leg, initial encounter: Secondary | ICD-10-CM | POA: Diagnosis not present

## 2019-11-13 DIAGNOSIS — S8011XA Contusion of right lower leg, initial encounter: Secondary | ICD-10-CM | POA: Diagnosis not present

## 2019-12-22 ENCOUNTER — Telehealth: Payer: Self-pay | Admitting: *Deleted

## 2019-12-22 DIAGNOSIS — Z87891 Personal history of nicotine dependence: Secondary | ICD-10-CM

## 2019-12-22 DIAGNOSIS — Z122 Encounter for screening for malignant neoplasm of respiratory organs: Secondary | ICD-10-CM

## 2019-12-22 DIAGNOSIS — E782 Mixed hyperlipidemia: Secondary | ICD-10-CM | POA: Diagnosis not present

## 2019-12-22 DIAGNOSIS — R739 Hyperglycemia, unspecified: Secondary | ICD-10-CM | POA: Diagnosis not present

## 2019-12-22 DIAGNOSIS — E538 Deficiency of other specified B group vitamins: Secondary | ICD-10-CM | POA: Diagnosis not present

## 2019-12-22 NOTE — Telephone Encounter (Signed)
(  12/22/2019) Left message for pt to notify them that it is time to schedule annual low dose lung cancer screening CT scan. Instructed patient to call back to verify information prior to the scan being scheduled °SRW °  ° ° °

## 2019-12-25 NOTE — Addendum Note (Signed)
Addended by: Lieutenant Diego on: 12/25/2019 10:40 AM   Modules accepted: Orders

## 2019-12-25 NOTE — Telephone Encounter (Signed)
Patient has been notified that annual lung cancer screening low dose CT scan is due currently or will be in near future. Confirmed that patient is within the age range of 55-77, and asymptomatic, (no signs or symptoms of lung cancer). Patient denies illness that would prevent curative treatment for lung cancer if found. Verified smoking history, (former, quit 2016, 63 pack year). The shared decision making visit was done 12/08/16. Patient is agreeable for CT scan being scheduled.

## 2019-12-29 DIAGNOSIS — J449 Chronic obstructive pulmonary disease, unspecified: Secondary | ICD-10-CM | POA: Diagnosis not present

## 2019-12-29 DIAGNOSIS — F319 Bipolar disorder, unspecified: Secondary | ICD-10-CM | POA: Diagnosis not present

## 2019-12-29 DIAGNOSIS — R739 Hyperglycemia, unspecified: Secondary | ICD-10-CM | POA: Diagnosis not present

## 2019-12-29 DIAGNOSIS — Z0000 Encounter for general adult medical examination without abnormal findings: Secondary | ICD-10-CM | POA: Diagnosis not present

## 2020-01-01 ENCOUNTER — Other Ambulatory Visit: Payer: Self-pay

## 2020-01-01 ENCOUNTER — Ambulatory Visit
Admission: RE | Admit: 2020-01-01 | Discharge: 2020-01-01 | Disposition: A | Discharge status: Home / Self Care | Payer: Medicare HMO | Source: Ambulatory Visit | Attending: Nurse Practitioner | Admitting: Nurse Practitioner

## 2020-01-01 DIAGNOSIS — Z122 Encounter for screening for malignant neoplasm of respiratory organs: Secondary | ICD-10-CM | POA: Diagnosis not present

## 2020-01-01 DIAGNOSIS — Z87891 Personal history of nicotine dependence: Secondary | ICD-10-CM | POA: Diagnosis not present

## 2020-01-01 DIAGNOSIS — F3181 Bipolar II disorder: Secondary | ICD-10-CM | POA: Diagnosis not present

## 2020-01-03 ENCOUNTER — Encounter: Payer: Self-pay | Admitting: *Deleted

## 2020-01-15 DIAGNOSIS — Z79899 Other long term (current) drug therapy: Secondary | ICD-10-CM | POA: Diagnosis not present

## 2020-01-15 DIAGNOSIS — M25519 Pain in unspecified shoulder: Secondary | ICD-10-CM | POA: Diagnosis not present

## 2020-01-22 ENCOUNTER — Other Ambulatory Visit: Payer: Self-pay

## 2020-01-22 ENCOUNTER — Ambulatory Visit: Payer: Medicare HMO | Admitting: Dermatology

## 2020-01-22 DIAGNOSIS — L719 Rosacea, unspecified: Secondary | ICD-10-CM | POA: Diagnosis not present

## 2020-01-22 NOTE — Progress Notes (Signed)
   Follow-Up Visit   Subjective  Monica Hill is a 64 y.o. female who presents for the following: Rosacea (Can not get places on face healed up before another one breaks out. She has used Metronidazole and it has not helped. A friend gave her a sample of Robb Matar and it works well.).  The following portions of the chart were reviewed this encounter and updated as appropriate:  Tobacco  Allergies  Meds  Problems  Med Hx  Surg Hx  Fam Hx     Review of Systems:  No other skin or systemic complaints except as noted in HPI or Assessment and Plan.  Objective  Well appearing patient in no apparent distress; mood and affect are within normal limits.  A focused examination was performed including face. Relevant physical exam findings are noted in the Assessment and Plan.    Assessment & Plan  Rosacea Face Small papules nose and cheeks. Azelaic acid 15%/Metronidazole 1%/Ivermectin 1% cream bid 30g 4RF - Rx sent to [facility].  Return in about 3 months (around 04/23/2020).   I, Ashok Cordia, CMA, am acting as scribe for Sarina Ser, MD .  Documentation: I have reviewed the above documentation for accuracy and completeness, and I agree with the above.  Sarina Ser, MD

## 2020-01-22 NOTE — Patient Instructions (Signed)
Instructions for [facility] Medications  One or more of your medications was sent to the [facility] mail order compounding pharmacy. You will receive an email from them and can purchase the medicine through that link. It will then be mailed to your home at the address you confirmed. If for any reason you do not receive an email from them, please check your spam folder. If you still do not find the email, please let us know. [facility] phone number is [phone].   

## 2020-01-29 ENCOUNTER — Encounter: Payer: Self-pay | Admitting: Dermatology

## 2020-01-30 DIAGNOSIS — G5602 Carpal tunnel syndrome, left upper limb: Secondary | ICD-10-CM | POA: Diagnosis not present

## 2020-01-30 DIAGNOSIS — E213 Hyperparathyroidism, unspecified: Secondary | ICD-10-CM | POA: Diagnosis not present

## 2020-01-30 DIAGNOSIS — M25561 Pain in right knee: Secondary | ICD-10-CM | POA: Diagnosis not present

## 2020-01-30 DIAGNOSIS — G8929 Other chronic pain: Secondary | ICD-10-CM | POA: Diagnosis not present

## 2020-01-30 DIAGNOSIS — M810 Age-related osteoporosis without current pathological fracture: Secondary | ICD-10-CM | POA: Diagnosis not present

## 2020-01-30 DIAGNOSIS — F319 Bipolar disorder, unspecified: Secondary | ICD-10-CM | POA: Diagnosis not present

## 2020-01-30 DIAGNOSIS — M25562 Pain in left knee: Secondary | ICD-10-CM | POA: Diagnosis not present

## 2020-01-30 DIAGNOSIS — M79642 Pain in left hand: Secondary | ICD-10-CM | POA: Diagnosis not present

## 2020-01-30 DIAGNOSIS — G5601 Carpal tunnel syndrome, right upper limb: Secondary | ICD-10-CM | POA: Diagnosis not present

## 2020-01-30 DIAGNOSIS — G5603 Carpal tunnel syndrome, bilateral upper limbs: Secondary | ICD-10-CM | POA: Diagnosis not present

## 2020-01-30 DIAGNOSIS — M8949 Other hypertrophic osteoarthropathy, multiple sites: Secondary | ICD-10-CM | POA: Diagnosis not present

## 2020-01-30 DIAGNOSIS — M79641 Pain in right hand: Secondary | ICD-10-CM | POA: Diagnosis not present

## 2020-02-29 DIAGNOSIS — G5602 Carpal tunnel syndrome, left upper limb: Secondary | ICD-10-CM | POA: Diagnosis not present

## 2020-02-29 DIAGNOSIS — G5603 Carpal tunnel syndrome, bilateral upper limbs: Secondary | ICD-10-CM | POA: Diagnosis not present

## 2020-02-29 DIAGNOSIS — M810 Age-related osteoporosis without current pathological fracture: Secondary | ICD-10-CM | POA: Diagnosis not present

## 2020-02-29 DIAGNOSIS — G5601 Carpal tunnel syndrome, right upper limb: Secondary | ICD-10-CM | POA: Diagnosis not present

## 2020-02-29 DIAGNOSIS — M8949 Other hypertrophic osteoarthropathy, multiple sites: Secondary | ICD-10-CM | POA: Diagnosis not present

## 2020-03-25 DIAGNOSIS — E559 Vitamin D deficiency, unspecified: Secondary | ICD-10-CM | POA: Diagnosis not present

## 2020-03-25 DIAGNOSIS — E210 Primary hyperparathyroidism: Secondary | ICD-10-CM | POA: Diagnosis not present

## 2020-03-25 DIAGNOSIS — E0590 Thyrotoxicosis, unspecified without thyrotoxic crisis or storm: Secondary | ICD-10-CM | POA: Diagnosis not present

## 2020-03-25 DIAGNOSIS — M816 Localized osteoporosis [Lequesne]: Secondary | ICD-10-CM | POA: Diagnosis not present

## 2020-04-03 DIAGNOSIS — F3181 Bipolar II disorder: Secondary | ICD-10-CM | POA: Diagnosis not present

## 2020-04-17 DIAGNOSIS — M542 Cervicalgia: Secondary | ICD-10-CM | POA: Diagnosis not present

## 2020-04-17 DIAGNOSIS — M25519 Pain in unspecified shoulder: Secondary | ICD-10-CM | POA: Diagnosis not present

## 2020-04-17 DIAGNOSIS — Z79899 Other long term (current) drug therapy: Secondary | ICD-10-CM | POA: Diagnosis not present

## 2020-04-24 IMAGING — US US ABDOMEN COMPLETE
1 series · 14 of 25 positions shown · non-contrast
Comparison: None.

CLINICAL DATA: Left upper quadrant pain, elevated LFT

EXAM:
ABDOMEN ULTRASOUND COMPLETE

[Series 1: us abdomen complete · 0.22mm/px · 14 of 92 slices shown]
[im 1/92]
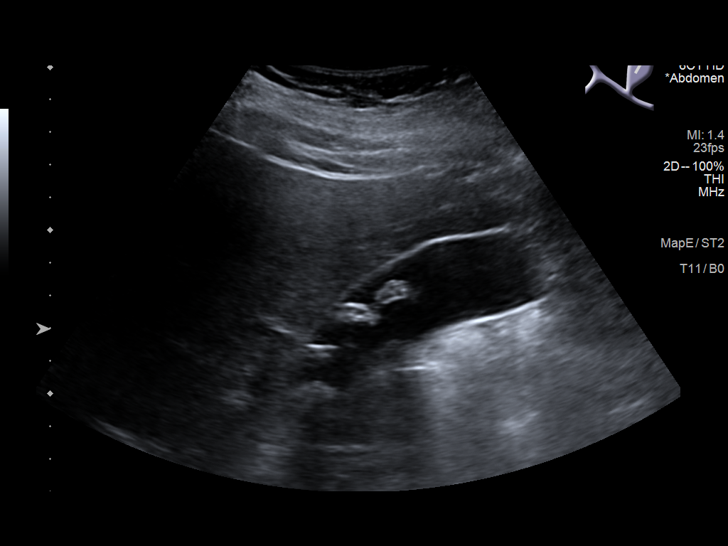
[im 8/92]
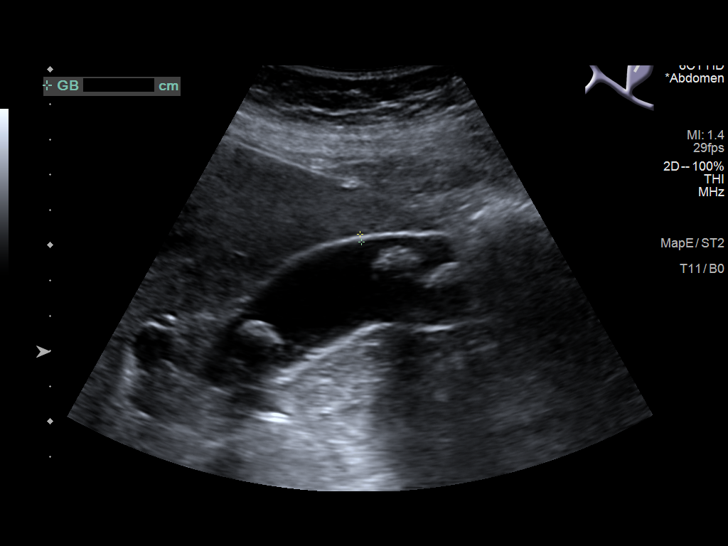
[im 16/92]
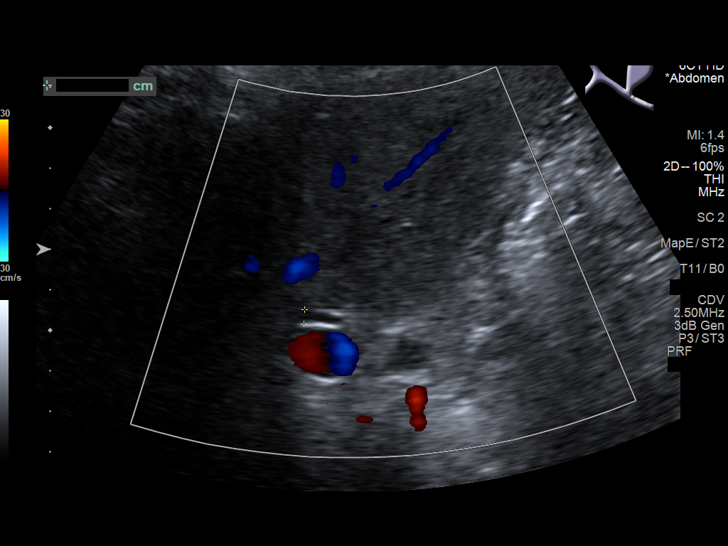
[im 23/92]
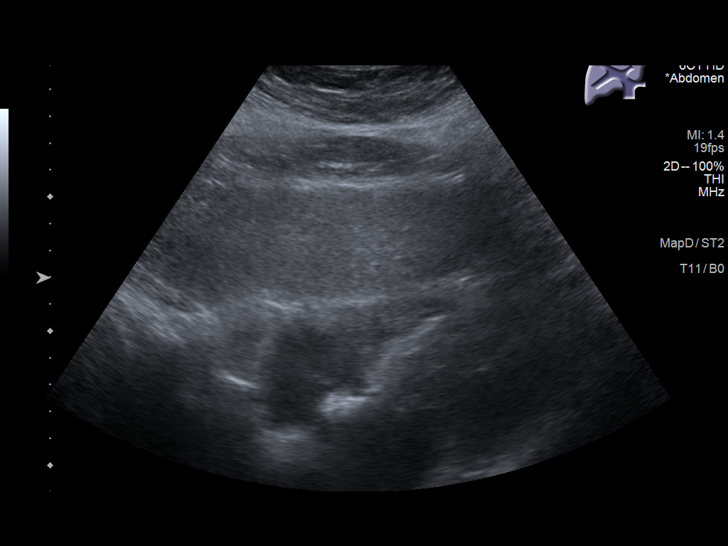
[im 31/92]
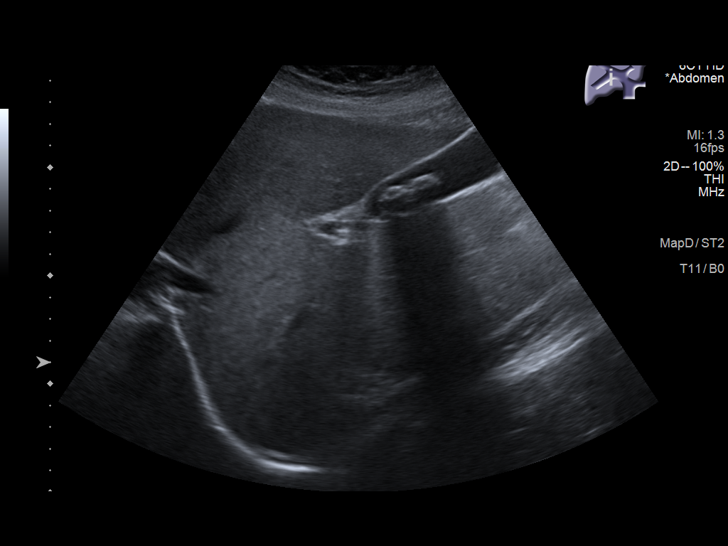
[im 35/92]
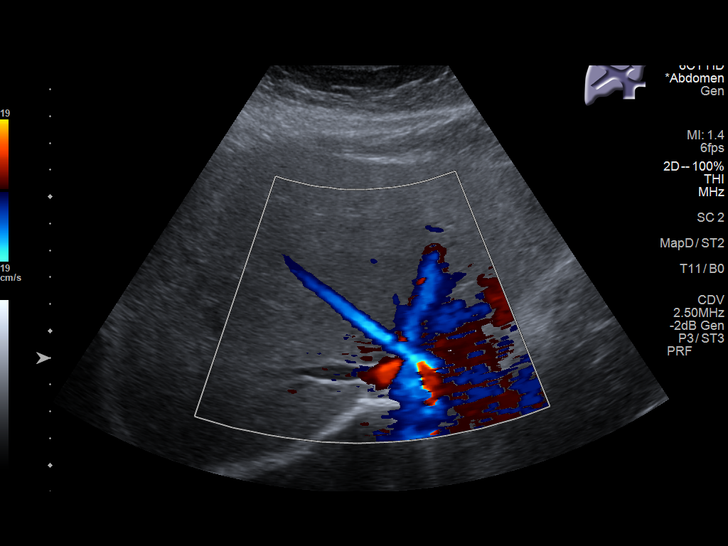
[im 42/92]
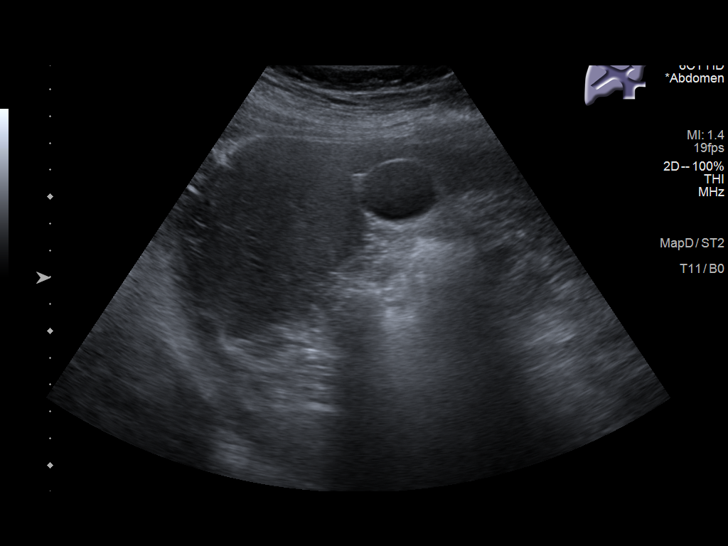
[im 50/92]
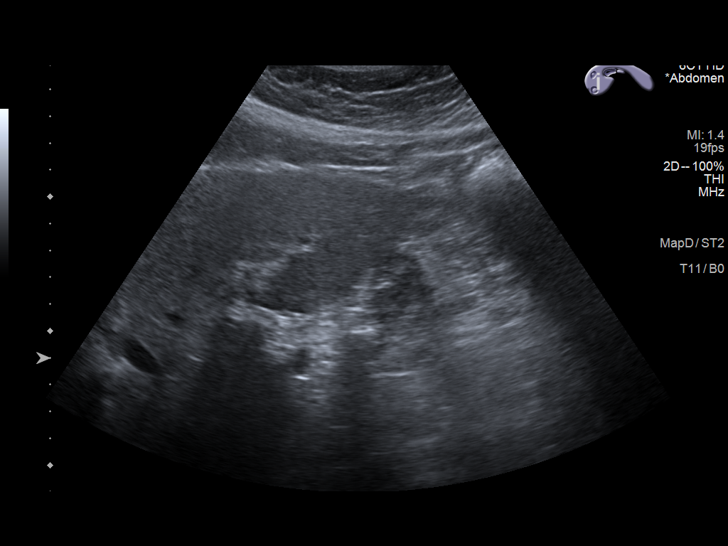
[im 57/92]
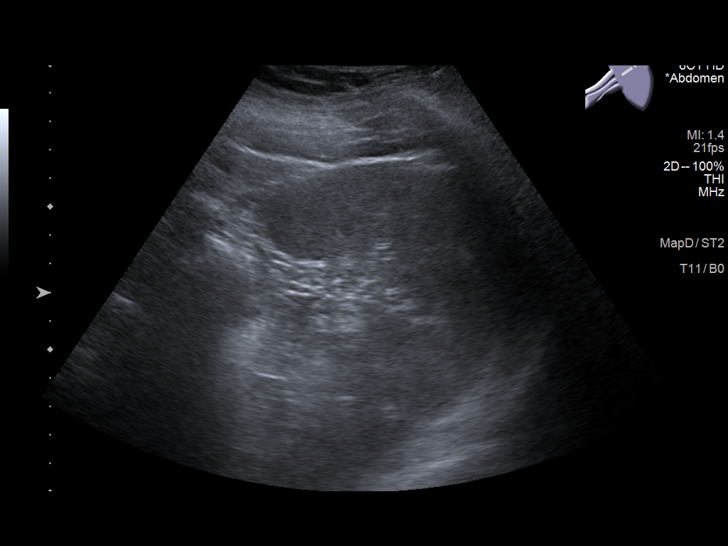
[im 61/92]
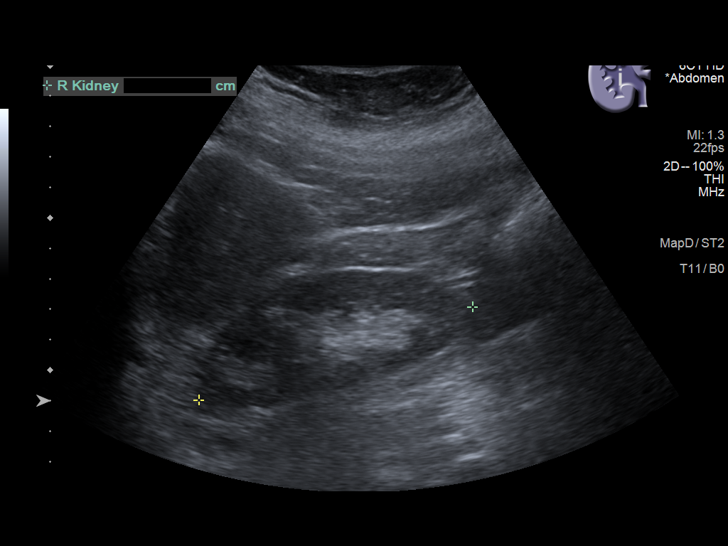
[im 69/92]
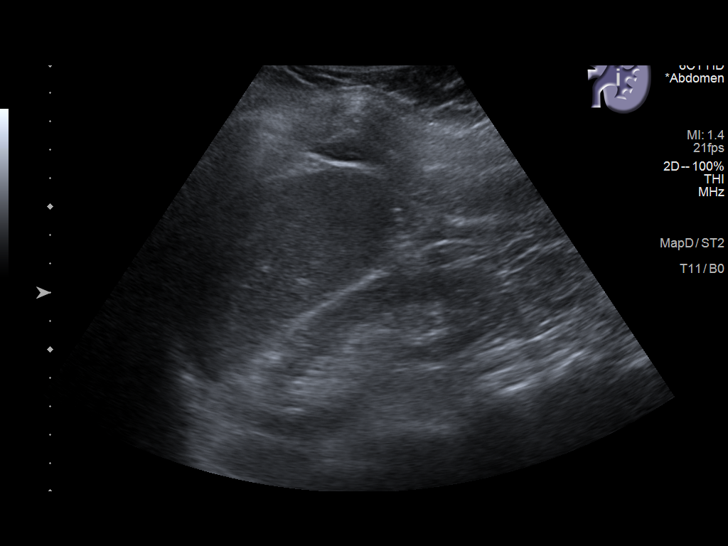
[im 76/92]
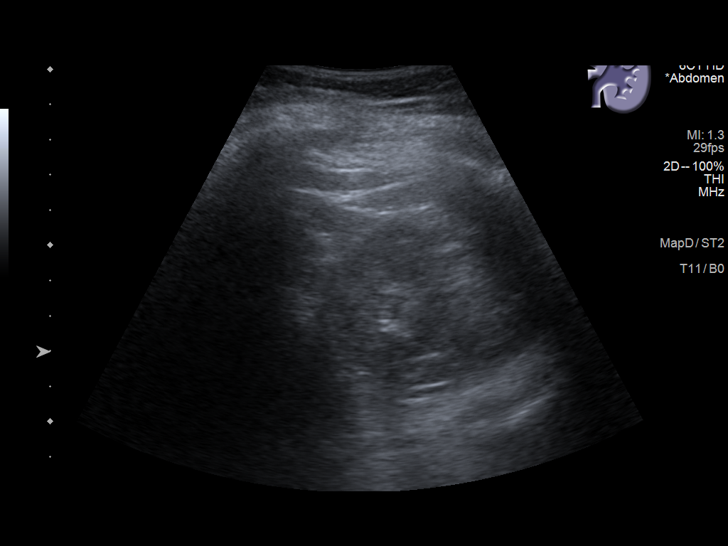
[im 84/92]
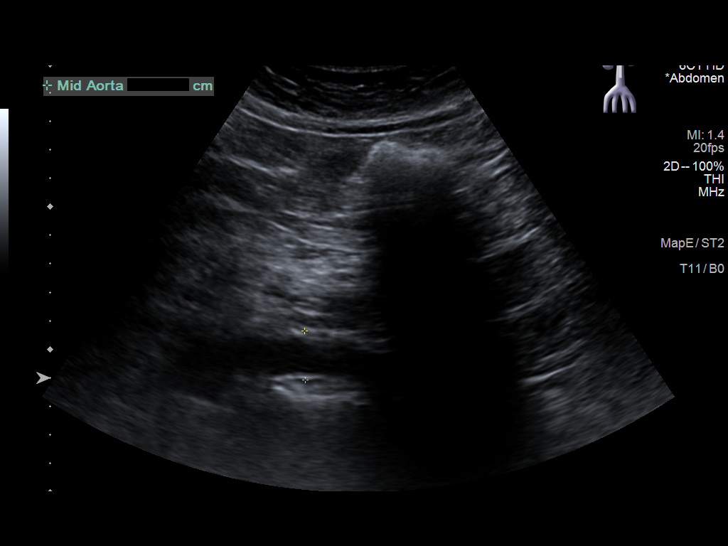
[im 92/92]
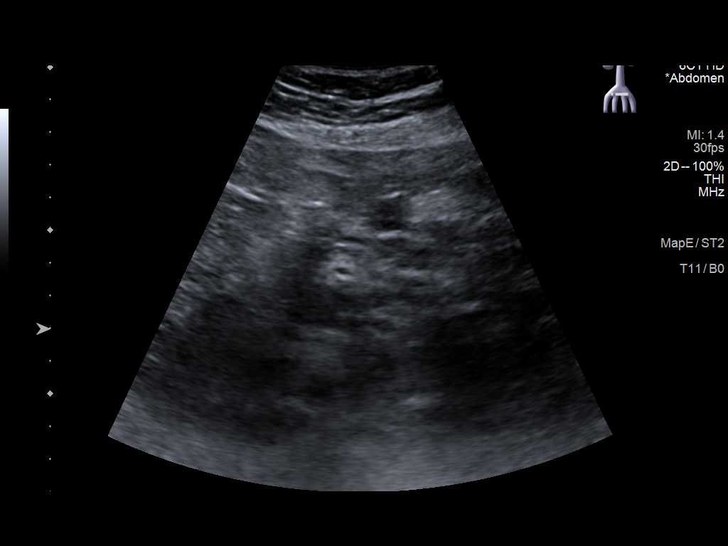

[14 of 25 positions shown; findings below may reference images not displayed]

FINDINGS: Gallbladder: Multiple shadowing stones measuring up to 1.5 cm.
Normal wall thickness. Negative sonographic Murphy.

Common bile duct: Diameter: 3.5 mm

Liver: Slight increased hepatic echogenicity. No focal hepatic
abnormality. Portal vein is patent on color Doppler imaging with
normal direction of blood flow towards the liver.

IVC: No abnormality visualized.

Pancreas: Visualized portion unremarkable.

Spleen: Size and appearance within normal limits.

Right Kidney: Length: 9.5 cm. Echogenicity within normal limits. No
mass or hydronephrosis visualized.

Left Kidney: Length: 11.1 cm. Echogenicity within normal limits. No
mass or hydronephrosis visualized.

Abdominal aorta: No aneurysm visualized.

Other findings: None.
IMPRESSION: 1. Cholelithiasis without sonographic evidence for acute
cholecystitis or biliary dilatation
2. Slightly echogenic liver as may be seen with steatosis and or
hepatocellular disease

## 2020-04-30 ENCOUNTER — Ambulatory Visit: Payer: Medicare HMO | Admitting: Dermatology

## 2020-05-02 DIAGNOSIS — E210 Primary hyperparathyroidism: Secondary | ICD-10-CM | POA: Diagnosis not present

## 2020-05-02 DIAGNOSIS — Z20822 Contact with and (suspected) exposure to covid-19: Secondary | ICD-10-CM | POA: Diagnosis not present

## 2020-06-27 DIAGNOSIS — M65312 Trigger thumb, left thumb: Secondary | ICD-10-CM | POA: Diagnosis not present

## 2020-07-03 DIAGNOSIS — F3181 Bipolar II disorder: Secondary | ICD-10-CM | POA: Diagnosis not present

## 2020-07-24 DIAGNOSIS — U071 COVID-19: Secondary | ICD-10-CM | POA: Diagnosis not present

## 2020-07-24 DIAGNOSIS — B349 Viral infection, unspecified: Secondary | ICD-10-CM | POA: Diagnosis not present

## 2020-07-24 DIAGNOSIS — Z853 Personal history of malignant neoplasm of breast: Secondary | ICD-10-CM | POA: Diagnosis not present

## 2020-07-24 DIAGNOSIS — J42 Unspecified chronic bronchitis: Secondary | ICD-10-CM | POA: Diagnosis not present

## 2020-07-29 DIAGNOSIS — J209 Acute bronchitis, unspecified: Secondary | ICD-10-CM | POA: Diagnosis not present

## 2020-07-29 DIAGNOSIS — F319 Bipolar disorder, unspecified: Secondary | ICD-10-CM | POA: Diagnosis not present

## 2020-07-29 DIAGNOSIS — R0600 Dyspnea, unspecified: Secondary | ICD-10-CM | POA: Diagnosis not present

## 2020-07-29 DIAGNOSIS — J449 Chronic obstructive pulmonary disease, unspecified: Secondary | ICD-10-CM | POA: Diagnosis not present

## 2020-07-29 DIAGNOSIS — U071 COVID-19: Secondary | ICD-10-CM | POA: Diagnosis not present

## 2020-07-29 DIAGNOSIS — J208 Acute bronchitis due to other specified organisms: Secondary | ICD-10-CM | POA: Diagnosis not present

## 2020-07-29 DIAGNOSIS — R059 Cough, unspecified: Secondary | ICD-10-CM | POA: Diagnosis not present

## 2020-07-31 DIAGNOSIS — Z79899 Other long term (current) drug therapy: Secondary | ICD-10-CM | POA: Diagnosis not present

## 2020-07-31 DIAGNOSIS — Z5181 Encounter for therapeutic drug level monitoring: Secondary | ICD-10-CM | POA: Diagnosis not present

## 2020-07-31 DIAGNOSIS — Z79891 Long term (current) use of opiate analgesic: Secondary | ICD-10-CM | POA: Diagnosis not present

## 2020-07-31 DIAGNOSIS — M25519 Pain in unspecified shoulder: Secondary | ICD-10-CM | POA: Diagnosis not present

## 2020-07-31 DIAGNOSIS — G894 Chronic pain syndrome: Secondary | ICD-10-CM | POA: Diagnosis not present

## 2020-09-11 DIAGNOSIS — Z0100 Encounter for examination of eyes and vision without abnormal findings: Secondary | ICD-10-CM | POA: Diagnosis not present

## 2020-09-11 DIAGNOSIS — H524 Presbyopia: Secondary | ICD-10-CM | POA: Diagnosis not present

## 2020-10-02 DIAGNOSIS — F3181 Bipolar II disorder: Secondary | ICD-10-CM | POA: Diagnosis not present

## 2020-10-30 DIAGNOSIS — G5603 Carpal tunnel syndrome, bilateral upper limbs: Secondary | ICD-10-CM | POA: Diagnosis not present

## 2020-10-30 DIAGNOSIS — M25519 Pain in unspecified shoulder: Secondary | ICD-10-CM | POA: Diagnosis not present

## 2020-10-30 DIAGNOSIS — G894 Chronic pain syndrome: Secondary | ICD-10-CM | POA: Diagnosis not present

## 2020-10-30 DIAGNOSIS — Z79899 Other long term (current) drug therapy: Secondary | ICD-10-CM | POA: Diagnosis not present

## 2020-11-05 DIAGNOSIS — E0590 Thyrotoxicosis, unspecified without thyrotoxic crisis or storm: Secondary | ICD-10-CM | POA: Diagnosis not present

## 2020-11-05 DIAGNOSIS — R7989 Other specified abnormal findings of blood chemistry: Secondary | ICD-10-CM | POA: Diagnosis not present

## 2020-11-05 DIAGNOSIS — E559 Vitamin D deficiency, unspecified: Secondary | ICD-10-CM | POA: Diagnosis not present

## 2020-11-12 DIAGNOSIS — Z6838 Body mass index (BMI) 38.0-38.9, adult: Secondary | ICD-10-CM | POA: Diagnosis not present

## 2020-11-12 DIAGNOSIS — E559 Vitamin D deficiency, unspecified: Secondary | ICD-10-CM | POA: Diagnosis not present

## 2020-11-12 DIAGNOSIS — E210 Primary hyperparathyroidism: Secondary | ICD-10-CM | POA: Diagnosis not present

## 2020-11-12 DIAGNOSIS — E042 Nontoxic multinodular goiter: Secondary | ICD-10-CM | POA: Diagnosis not present

## 2020-11-26 DIAGNOSIS — K589 Irritable bowel syndrome without diarrhea: Secondary | ICD-10-CM | POA: Diagnosis not present

## 2020-11-26 DIAGNOSIS — K2900 Acute gastritis without bleeding: Secondary | ICD-10-CM | POA: Diagnosis not present

## 2020-12-16 DIAGNOSIS — J441 Chronic obstructive pulmonary disease with (acute) exacerbation: Secondary | ICD-10-CM | POA: Diagnosis not present

## 2020-12-16 DIAGNOSIS — F319 Bipolar disorder, unspecified: Secondary | ICD-10-CM | POA: Diagnosis not present

## 2020-12-23 DIAGNOSIS — F3341 Major depressive disorder, recurrent, in partial remission: Secondary | ICD-10-CM | POA: Diagnosis not present

## 2020-12-23 DIAGNOSIS — C50011 Malignant neoplasm of nipple and areola, right female breast: Secondary | ICD-10-CM | POA: Diagnosis not present

## 2020-12-23 DIAGNOSIS — R739 Hyperglycemia, unspecified: Secondary | ICD-10-CM | POA: Diagnosis not present

## 2020-12-23 DIAGNOSIS — I700 Atherosclerosis of aorta: Secondary | ICD-10-CM | POA: Diagnosis not present

## 2020-12-30 DIAGNOSIS — Z0000 Encounter for general adult medical examination without abnormal findings: Secondary | ICD-10-CM | POA: Diagnosis not present

## 2020-12-30 DIAGNOSIS — R739 Hyperglycemia, unspecified: Secondary | ICD-10-CM | POA: Diagnosis not present

## 2020-12-30 DIAGNOSIS — F319 Bipolar disorder, unspecified: Secondary | ICD-10-CM | POA: Diagnosis not present

## 2020-12-30 DIAGNOSIS — J449 Chronic obstructive pulmonary disease, unspecified: Secondary | ICD-10-CM | POA: Diagnosis not present

## 2020-12-30 DIAGNOSIS — Z1389 Encounter for screening for other disorder: Secondary | ICD-10-CM | POA: Diagnosis not present

## 2020-12-30 DIAGNOSIS — E119 Type 2 diabetes mellitus without complications: Secondary | ICD-10-CM | POA: Diagnosis not present

## 2020-12-30 DIAGNOSIS — C50919 Malignant neoplasm of unspecified site of unspecified female breast: Secondary | ICD-10-CM | POA: Diagnosis not present

## 2021-01-02 DIAGNOSIS — M545 Low back pain, unspecified: Secondary | ICD-10-CM | POA: Diagnosis not present

## 2021-01-02 DIAGNOSIS — Z5181 Encounter for therapeutic drug level monitoring: Secondary | ICD-10-CM | POA: Diagnosis not present

## 2021-01-02 DIAGNOSIS — G894 Chronic pain syndrome: Secondary | ICD-10-CM | POA: Diagnosis not present

## 2021-01-02 DIAGNOSIS — Z79899 Other long term (current) drug therapy: Secondary | ICD-10-CM | POA: Diagnosis not present

## 2021-01-02 DIAGNOSIS — M25519 Pain in unspecified shoulder: Secondary | ICD-10-CM | POA: Diagnosis not present

## 2021-01-02 DIAGNOSIS — Z79891 Long term (current) use of opiate analgesic: Secondary | ICD-10-CM | POA: Diagnosis not present

## 2021-01-10 DIAGNOSIS — F3181 Bipolar II disorder: Secondary | ICD-10-CM | POA: Diagnosis not present

## 2021-02-12 DIAGNOSIS — Z79899 Other long term (current) drug therapy: Secondary | ICD-10-CM | POA: Diagnosis not present

## 2021-02-12 DIAGNOSIS — F32A Depression, unspecified: Secondary | ICD-10-CM | POA: Diagnosis not present

## 2021-02-26 DIAGNOSIS — F3181 Bipolar II disorder: Secondary | ICD-10-CM | POA: Diagnosis not present

## 2021-02-27 DIAGNOSIS — K8020 Calculus of gallbladder without cholecystitis without obstruction: Secondary | ICD-10-CM | POA: Diagnosis not present

## 2021-02-27 DIAGNOSIS — R110 Nausea: Secondary | ICD-10-CM | POA: Diagnosis not present

## 2021-02-27 DIAGNOSIS — K224 Dyskinesia of esophagus: Secondary | ICD-10-CM | POA: Diagnosis not present

## 2021-02-27 DIAGNOSIS — R142 Eructation: Secondary | ICD-10-CM | POA: Diagnosis not present

## 2021-02-27 DIAGNOSIS — R1314 Dysphagia, pharyngoesophageal phase: Secondary | ICD-10-CM | POA: Diagnosis not present

## 2021-02-27 DIAGNOSIS — R1013 Epigastric pain: Secondary | ICD-10-CM | POA: Diagnosis not present

## 2021-02-27 DIAGNOSIS — K219 Gastro-esophageal reflux disease without esophagitis: Secondary | ICD-10-CM | POA: Diagnosis not present

## 2021-02-27 DIAGNOSIS — R1011 Right upper quadrant pain: Secondary | ICD-10-CM | POA: Diagnosis not present

## 2021-02-27 DIAGNOSIS — Z86010 Personal history of colonic polyps: Secondary | ICD-10-CM | POA: Diagnosis not present

## 2021-03-04 ENCOUNTER — Other Ambulatory Visit: Payer: Self-pay | Admitting: Gastroenterology

## 2021-03-04 DIAGNOSIS — R110 Nausea: Secondary | ICD-10-CM

## 2021-03-04 DIAGNOSIS — R1013 Epigastric pain: Secondary | ICD-10-CM

## 2021-03-04 DIAGNOSIS — R1011 Right upper quadrant pain: Secondary | ICD-10-CM

## 2021-03-05 DIAGNOSIS — G894 Chronic pain syndrome: Secondary | ICD-10-CM | POA: Diagnosis not present

## 2021-03-05 DIAGNOSIS — M25519 Pain in unspecified shoulder: Secondary | ICD-10-CM | POA: Diagnosis not present

## 2021-03-05 DIAGNOSIS — Z79899 Other long term (current) drug therapy: Secondary | ICD-10-CM | POA: Diagnosis not present

## 2021-03-05 DIAGNOSIS — Z79891 Long term (current) use of opiate analgesic: Secondary | ICD-10-CM | POA: Diagnosis not present

## 2021-03-05 DIAGNOSIS — M545 Low back pain, unspecified: Secondary | ICD-10-CM | POA: Diagnosis not present

## 2021-03-05 DIAGNOSIS — Z5181 Encounter for therapeutic drug level monitoring: Secondary | ICD-10-CM | POA: Diagnosis not present

## 2021-03-14 ENCOUNTER — Ambulatory Visit
Admission: RE | Admit: 2021-03-14 | Discharge: 2021-03-14 | Disposition: A | Discharge status: Home / Self Care | Payer: Medicare HMO | Source: Ambulatory Visit | Attending: Gastroenterology | Admitting: Gastroenterology

## 2021-03-14 DIAGNOSIS — R110 Nausea: Secondary | ICD-10-CM | POA: Diagnosis not present

## 2021-03-14 DIAGNOSIS — R1011 Right upper quadrant pain: Secondary | ICD-10-CM | POA: Diagnosis not present

## 2021-03-14 DIAGNOSIS — K8020 Calculus of gallbladder without cholecystitis without obstruction: Secondary | ICD-10-CM | POA: Diagnosis not present

## 2021-03-14 DIAGNOSIS — N2889 Other specified disorders of kidney and ureter: Secondary | ICD-10-CM | POA: Diagnosis not present

## 2021-03-14 DIAGNOSIS — R1012 Left upper quadrant pain: Secondary | ICD-10-CM | POA: Diagnosis not present

## 2021-03-14 DIAGNOSIS — R1013 Epigastric pain: Secondary | ICD-10-CM | POA: Diagnosis not present

## 2021-03-17 DIAGNOSIS — H60543 Acute eczematoid otitis externa, bilateral: Secondary | ICD-10-CM | POA: Diagnosis not present

## 2021-03-17 DIAGNOSIS — D65 Disseminated intravascular coagulation [defibrination syndrome]: Secondary | ICD-10-CM | POA: Diagnosis not present

## 2021-03-20 ENCOUNTER — Other Ambulatory Visit: Payer: Self-pay

## 2021-03-20 ENCOUNTER — Ambulatory Visit: Payer: Medicare HMO | Admitting: Dermatology

## 2021-03-20 DIAGNOSIS — H61001 Unspecified perichondritis of right external ear: Secondary | ICD-10-CM | POA: Diagnosis not present

## 2021-03-20 NOTE — Progress Notes (Signed)
   Follow-Up Visit   Subjective  Monica Hill is a 65 y.o. female who presents for the following: Skin Problem (Check growth on the right ear).  The following portions of the chart were reviewed this encounter and updated as appropriate:   Tobacco  Allergies  Meds  Problems  Med Hx  Surg Hx  Fam Hx     Review of Systems:  No other skin or systemic complaints except as noted in HPI or Assessment and Plan.  Objective  Well appearing patient in no apparent distress; mood and affect are within normal limits.  A focused examination was performed including face,right ear. Relevant physical exam findings are noted in the Assessment and Plan.  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   Avoid sleeping on the right ear, recommend pt sleep on doughnut shape pillow to avoid pressure on this ear   May consider biopsy if no better   Destruction of lesion - right ear mid helix in sulcus between helix and antihelix Complexity: simple   Destruction method: cryotherapy   Informed consent: discussed and consent obtained   Timeout:  patient name, date of birth, surgical site, and procedure verified Lesion destroyed using liquid nitrogen: Yes   Region frozen until ice ball extended beyond lesion: Yes   Outcome: patient tolerated procedure well with no complications   Post-procedure details: wound care instructions given    Return in about 3 months (around 06/20/2021) for [facility].  IMarye Round, CMA, am acting as scribe for Sarina Ser, MD .  Documentation: I have reviewed the above documentation for accuracy and completeness, and I agree with the above.  Sarina Ser, MD.asc

## 2021-03-20 NOTE — Patient Instructions (Addendum)
Mometasone cream apply to affected ear 5 days a week as needed   If you have any questions or concerns for your doctor, please call our main line at [phone] and press option 4 to reach your doctor's medical assistant. If no one answers, please leave a voicemail as directed and we will return your call as soon as possible. Messages left after 4 pm will be answered the following business day.   You may also send [place] a message via [place]. We typically respond to MyChart messages within 1-2 business days.  For prescription refills, please ask your pharmacy to contact our office. Our fax number is [phone].  If you have an urgent issue when the clinic is closed that cannot wait until the next business day, you can page your doctor at the number below.    Please note that while we do our best to be available for urgent issues outside of office hours, we are not available 24/7.   If you have an urgent issue and are unable to reach [place], you may choose to seek medical care at your doctor's office, retail clinic, urgent care center, or emergency room.  If you have a medical emergency, please immediately call 911 or go to the emergency department.  Pager Numbers  - Dr. Nehemiah Massed: [phone]  - Dr. Laurence Ferrari: [phone]  - Dr. Nicole Kindred: [phone]  In the event of inclement weather, please call our main line at [phone] for an update on the status of any delays or closures.  Dermatology Medication Tips: Please keep the boxes that topical medications come in in order to help keep track of the instructions about where and how to use these. Pharmacies typically print the medication instructions only on the boxes and not directly on the medication tubes.   If your medication is too expensive, please contact our office at [phone] option 4 or send [place] a message through [place].   We are unable to tell what your co-pay for medications will be in advance as this is different depending on  your insurance coverage. However, we may be able to find a substitute medication at lower cost or fill out paperwork to get insurance to cover a needed medication.   If a prior authorization is required to get your medication covered by your insurance company, please allow [place] 1-2 business days to complete this process.  Drug prices often vary depending on where the prescription is filled and some pharmacies may offer cheaper prices.  The website www.goodrx.com contains coupons for medications through different pharmacies. The prices here do not account for what the cost may be with help from insurance (it may be cheaper with your insurance), but the website can give you the price if you did not use any insurance.  - You can print the associated coupon and take it with your prescription to the pharmacy.  - You may also stop by our office during regular business hours and pick up a GoodRx coupon card.  - If you need your prescription sent electronically to a different pharmacy, notify our office through [facility] or by phone at [phone] option 4.

## 2021-03-21 DIAGNOSIS — R233 Spontaneous ecchymoses: Secondary | ICD-10-CM | POA: Diagnosis not present

## 2021-03-21 DIAGNOSIS — R1011 Right upper quadrant pain: Secondary | ICD-10-CM | POA: Diagnosis not present

## 2021-03-21 DIAGNOSIS — E119 Type 2 diabetes mellitus without complications: Secondary | ICD-10-CM | POA: Diagnosis not present

## 2021-03-24 ENCOUNTER — Encounter: Payer: Self-pay | Admitting: Dermatology

## 2021-04-01 DIAGNOSIS — K8020 Calculus of gallbladder without cholecystitis without obstruction: Secondary | ICD-10-CM | POA: Diagnosis not present

## 2021-04-02 ENCOUNTER — Ambulatory Visit: Payer: Self-pay | Admitting: General Surgery

## 2021-04-02 NOTE — H&P (Signed)
PATIENT PROFILE: Monica Hill is a 65 y.o. female who presents to the Clinic for consultation at the request of Dr. Sabra Heck for evaluation of cholelithiasis.  PCP:  Yevonne Pax, MD  HISTORY OF PRESENT ILLNESS: Monica Hill reports having abdominal pain since few month ago.  She endorsed that the pain is mainly on the right upper quadrant but radiates to the lower abdomen.  The pain is sometimes aggravated by oral intake.  She also has history of severe constipation.  She has multiple other abdominal complaints there are nonspecific.  No alleviating factors identified at this moment.  Patient endorses having some nausea as well.  No significant vomiting.  No fever.  She has been evaluated by gastroenterology.  They recommended colonoscopy and upper endoscopy.  Abdominal ultrasound was done showing large stones in her gallbladder.  I personally evaluated the images.   PROBLEM LIST: Problem List  Date Reviewed: 05/15/2019          Noted   Hyperparathyroidism, primary (CMS-HCC) 12/30/2020   Overview    Endocrine      Type 2 diabetes mellitus with hyperlipidemia (CMS-HCC) 12/30/2020   Aortic atherosclerosis (CMS-HCC) Unknown   Medicare annual wellness visit, initial 12/28/2018   Overview    7/19, 7/20, 7/21, 7/22      Mixed stress and urge urinary incontinence 11/20/2016   COPD mixed type , unspecified (CMS-HCC) 11/20/2016   Recurrent major depressive disorder, in partial remission (CMS-HCC) 09/09/2016   History of total shoulder replacement, left 01/14/2016   Bipolar and related disorder (CMS-HCC) 02/26/2014   Overview    Dr. Thurmond Butts, psychiatry      Breast CA (CMS-HCC) Unknown   Degenerative joint disease of knee 12/18/2013   Osteoarthritis ([place]) Unknown   Overview     a.  Shoulder replacement.       Thyroid disease Unknown   Hyperlipidemia 10/24/2013       GENERAL REVIEW OF SYSTEMS:   General ROS: negative for - chills, fatigue, fever, weight gain or weight loss Allergy  and Immunology ROS: negative for - hives  Hematological and Lymphatic ROS: negative for - bleeding problems or bruising, negative for palpable nodes Endocrine ROS: negative for - heat or cold intolerance, hair changes Respiratory ROS: negative for - cough, shortness of breath or wheezing Cardiovascular ROS: no chest pain or palpitations GI ROS: Positive for nausea, abdominal pain, constipation Musculoskeletal ROS: negative for - joint swelling or muscle pain Neurological ROS: negative for - confusion, syncope Dermatological ROS: negative for pruritus and rash Psychiatric: negative for anxiety, depression, difficulty sleeping and memory loss  MEDICATIONS: Current Outpatient Medications  Medication Sig Dispense Refill   albuterol 90 mcg/actuation inhaler Inhale 2 inhalations into the lungs every 6 (six) hours as needed for Wheezing 1 each 11   cholecalciferol (VITAMIN D3) 400 unit tablet Take 800 Units by mouth once daily     famotidine (PEPCID) 40 MG tablet Take 1 tablet (40 mg total) by mouth nightly 90 tablet 2   fluticasone furoate-vilanteroL (BREO ELLIPTA) 100-25 mcg/dose DsDv inhaler Inhale 1 inhalation into the lungs once daily 3 each 3   fluticasone propionate (FLONASE) 50 mcg/actuation nasal spray USE 2 SPRAYS IN EACH NOSTRIL ONE TIME DAILY 48 g 2   HYDROcodone-acetaminophen (NORCO) 5-325 mg tablet Take 1 tablet by mouth 2 (two) times daily     ipratropium-albuteroL (DUO-NEB) nebulizer solution Take by nebulization as needed     lithium carbonate 300 MG capsule Take 300 mg by mouth 2 (two)  times daily with meals        mometasone (ELOCON) 0.1 % cream once daily     montelukast (SINGULAIR) 10 mg tablet Take 1 tablet (10 mg total) by mouth once daily 90 tablet 3   PARoxetine (PAXIL) 30 MG tablet once daily     QUEtiapine (SEROQUEL) 100 MG tablet Take 100 mg by mouth once daily Take with 50 mg tablet.     QUEtiapine (SEROQUEL) 25 MG tablet Take 1 tablet (25 mg total) by mouth nightly.  (Patient taking differently: 50 mg Takes 50 mg along with 100 mg daily takes 25 extra as needed.) 90 tablet 3   semaglutide (OZEMPIC) 0.25 mg or 0.5 mg(2 mg/1.5 mL) pen injector Inject 0.375 mLs (0.5 mg total) subcutaneously once a week for 30 days 0.75 mL 5   pantoprazole (PROTONIX) 40 MG DR tablet Take 1 tablet (40 mg total) by mouth once daily (Patient not taking: Reported on 04/01/2021) 90 tablet 3   rosuvastatin (CRESTOR) 10 MG tablet Take 1 tablet (10 mg total) by mouth once daily (Patient not taking: Reported on 04/01/2021) 30 tablet 11   No current facility-administered medications for this visit.    ALLERGIES: Patient has no known allergies.  PAST MEDICAL HISTORY: Past Medical History:  Diagnosis Date   Aortic atherosclerosis (CMS-HCC)    Bipolar and related disorder (CMS-HCC)    Cancer (CMS-HCC)    COPD mixed type , unspecified (CMS-HCC)    GERD (gastroesophageal reflux disease)    Incontinence of urine    Left breast CA (CMS-HCC) 2005   Osteoporosis    Shingles 04/2016   subclinical hyperthyroidism    Substance abuse (CMS-HCC) 1976   smoking   Tobacco abuse    Type 2 diabetes mellitus with hyperlipidemia (CMS-HCC) 12/30/2020    PAST SURGICAL HISTORY: Past Surgical History:  Procedure Laterality Date   ARTHROPLASTY TOTAL SHOULDER Left 04/08/2016   Procedure: LEFT OPEN REVISION TOTAL SHOULDER REPLACEMENT, ROTATOR CUFF REPAIR,;  Surgeon: Genia Plants, MD;  Location: [place];  Service: Orthopedics;  Laterality: Left;   ARTHROSCOPIC SUBACROMIAL DECOMP Left 02/07/2016   Procedure: ARTHROSCOPY, SHOULDER, SURGICAL; DECOMPRESSION SUBACROMIAL SPACE W/PARTIAL ACROMIOPLASTY, W/CORACOACROMIAL LIGAMENT RELEASE, WHEN PERFORMED (LIST IN ADDITION TO PRIMARY PROCEDURE);  Surgeon: Genia Plants, MD;  Location: [place];  Service: Orthopedics;  Laterality: Left;   ARTHROSCOPY SHOULDER W/DEBRIDEMENT Left 02/07/2016   Procedure: LEFT SHOULDER ARTHROSCOPY, ARTHROSCOPIC SUBACROMIAL  DECOMPRESSION, CAPSULAR RELEASE, TENDON DEBRIDEMENT, JOINT DEBRIDEMENT ;  Surgeon: Genia Plants, MD;  Location: [place];  Service: Orthopedics;  Laterality: Left;   COLONOSCOPY  11/11/2006 [facility]   Hyperplastic Polyp/Repeat 14yrs/[facility]   COLONOSCOPY  03/23/2017   Tubular adenoma of the colon/Repeat 75yrs/[facility]   cyst removed from back     EGD  02/24/12, 11/11/2006, 03/04/2001, 06/17/1998   Reflux Esophagitis.Marland KitchenNo Repeat/[place]   EGD  03/23/2017   Normal EGD biopsy/No Repeat/[facility]   JOINT REPLACEMENT Left    x3 triangle orthopedics   MASTECTOMY     MASTECTOMY MODIFIED RADICAL Bilateral    with reconstruction and silicone implants   REPAIR ROTATOR CUFF TEAR CHRONIC OPEN Left 04/08/2016   Procedure: REPAIR OF RUPTURED MUSCULOTENDINOUS CUFF (EG, ROTATOR CUFF) OPEN; CHRONIC;  Surgeon: Genia Plants, MD;  Location: [place];  Service: Orthopedics;  Laterality: Left;   skin lesion removal Right 09/2014   non canerous, right breast   TONSILLECTOMY     TUBAL LIGATION     vocal cord resection     dr Tami Ribas 2006  FAMILY HISTORY: Family History  Problem Relation Age of Onset   Lupus Mother    Arthritis Mother    COPD Mother    Throat cancer Father    Prostate cancer Father    Myocardial Infarction (Heart attack) Father    Thyroid disease Father    Esophageal cancer Father    COPD Sister      SOCIAL HISTORY: Social History   Socioeconomic History   Marital status: Married  Tobacco Use   Smoking status: Former Smoker    Packs/day: 1.00    Years: 30.00    Pack years: 30.00    Start date: 11/15/1973    Quit date: 01/20/2016    Years since quitting: 5.2   Smokeless tobacco: Never Used   Tobacco comment: Pt currently uses vapor cigarettes  Vaping Use   Vaping Use: Every day  Substance and Sexual Activity   Alcohol use: Never    Alcohol/week: 0.0 standard drinks   Drug use: No   Sexual activity: Yes    Partners: Male    Comment: Husband    PHYSICAL EXAM: Vitals:   04/01/21 1436   BP: 125/81  Pulse: 70   Body mass index is 37.03 kg/m. Weight: 88.9 kg (196 lb)   GENERAL: Alert, active, oriented x3  HEENT: Pupils equal reactive to light. Extraocular movements are intact. Sclera clear. Palpebral conjunctiva normal red color.Pharynx clear.  NECK: Supple with no palpable mass and no adenopathy.  LUNGS: Sound clear with no rales rhonchi or wheezes.  HEART: Regular rhythm S1 and S2 without murmur.  ABDOMEN: Soft and depressible, nontender with no palpable mass, no hepatomegaly.   EXTREMITIES: Well-developed well-nourished symmetrical with no dependent edema.  NEUROLOGICAL: Awake alert oriented, facial expression symmetrical, moving all extremities.  REVIEW OF DATA: I have reviewed the following data today: Office Visit on 03/21/2021  Component Date Value   WBC (White Blood Cell Co* 03/21/2021 7.5    RBC (Red Blood Cell Coun* 03/21/2021 4.75    Hemoglobin 03/21/2021 13.7    Hematocrit 03/21/2021 42.8    MCV (Mean Corpuscular Vo* 03/21/2021 90.1    MCH (Mean Corpuscular He* 03/21/2021 28.8    MCHC (Mean Corpuscular H* 03/21/2021 32.0    Platelet Count 03/21/2021 370    RDW-CV (Red Cell Distrib* 03/21/2021 13.2    MPV (Mean Platelet Volum* 03/21/2021 9.4    Neutrophils 03/21/2021 5.39    Lymphocytes 03/21/2021 1.27    Monocytes 03/21/2021 0.45    Eosinophils 03/21/2021 0.29    Basophils 03/21/2021 0.04    Neutrophil % 03/21/2021 72.5 (!)   Lymphocyte % 03/21/2021 17.0    Monocyte % 03/21/2021 6.0    Eosinophil % 03/21/2021 3.9    Basophil% 03/21/2021 0.5    Immature Granulocyte % 03/21/2021 0.1    Immature Granulocyte Cou* 03/21/2021 0.01    Prothrombin Time 03/21/2021 13.7    Prothrombin INR 03/21/2021 1.2 (!)   aPTT - [facility] 03/21/2021 26      ASSESSMENT: Ms. Byrum is a 65 y.o. female presenting for consultation for cholelithiasis.    Patient was oriented about the diagnosis of cholelithiasis / biliary dyskinesia. Also oriented about what  is the gallbladder, its anatomy and function and the implications of having stones. The patient was oriented about the treatment alternatives (observation vs cholecystectomy). Patient was oriented that a low percentage of patient will continue to have similar pain symptoms even after the gallbladder is removed. Surgical technique (open vs laparoscopic) was discussed. It was also discussed  the goals of the surgery (decrease the pain episodes and avoid the risk of cholecystitis) and the risk of surgery including: bleeding, infection, common bile duct injury, stone retention, injury to other organs such as bowel, liver, stomach, other complications such as hernia, bowel obstruction among others. Also discussed with patient about anesthesia and its complications such as: reaction to medications, pneumonia, heart complications, death, among others.   Due to her multiple nonspecific abdominal complaints I think that is reasonable to keep the appointment for the upper endoscopy and colonoscopy.  They will get a complete evaluation of other causes of abdominal pain as well.  I do think the patient would benefit a cholecystectomy due to the size of the gallstones on the right upper quadrant pain.  I do think that this will resolve all her abdominal issues but she will have some relief of some of the pains as well.  The patient endorses she understood these recommendations and agreed to proceed with cholecystectomy.  Cholelithiasis without cholecystitis [K80.20]  PLAN: 1.  Robotic assisted laparoscopic cholecystectomy (26415) 2.  Do not take aspirin 5 days before the procedure 3.  Contact us if has any question or concern.

## 2021-04-02 NOTE — H&P (View-Only) (Signed)
PATIENT PROFILE: Monica Hill is a 65 y.o. female who presents to the Clinic for consultation at the request of Dr. Sabra Heck for evaluation of cholelithiasis.  PCP:  Yevonne Pax, MD  HISTORY OF PRESENT ILLNESS: [name] reports having abdominal pain since few month ago.  She endorsed that the pain is mainly on the right upper quadrant but radiates to the lower abdomen.  The pain is sometimes aggravated by oral intake.  She also has history of severe constipation.  She has multiple other abdominal complaints there are nonspecific.  No alleviating factors identified at this moment.  Patient endorses having some nausea as well.  No significant vomiting.  No fever.  She has been evaluated by gastroenterology.  They recommended colonoscopy and upper endoscopy.  Abdominal ultrasound was done showing large stones in her gallbladder.  I personally evaluated the images.   PROBLEM LIST: Problem List  Date Reviewed: 05/15/2019          Noted   Hyperparathyroidism, primary (CMS-HCC) 12/30/2020   Overview    Endocrine      Type 2 diabetes mellitus with hyperlipidemia (CMS-HCC) 12/30/2020   Aortic atherosclerosis (CMS-HCC) Unknown   Medicare annual wellness visit, initial 12/28/2018   Overview    7/19, 7/20, 7/21, 7/22      Mixed stress and urge urinary incontinence 11/20/2016   COPD mixed type , unspecified (CMS-HCC) 11/20/2016   Recurrent major depressive disorder, in partial remission (CMS-HCC) 09/09/2016   History of total shoulder replacement, left 01/14/2016   Bipolar and related disorder (CMS-HCC) 02/26/2014   Overview    Dr. Thurmond Butts, psychiatry      Breast CA (CMS-HCC) Unknown   Degenerative joint disease of knee 12/18/2013   Osteoarthritis ([place]) Unknown   Overview     a.  Shoulder replacement.       Thyroid disease Unknown   Hyperlipidemia 10/24/2013       GENERAL REVIEW OF SYSTEMS:   General ROS: negative for - chills, fatigue, fever, weight gain or weight loss Allergy  and Immunology ROS: negative for - hives  Hematological and Lymphatic ROS: negative for - bleeding problems or bruising, negative for palpable nodes Endocrine ROS: negative for - heat or cold intolerance, hair changes Respiratory ROS: negative for - cough, shortness of breath or wheezing Cardiovascular ROS: no chest pain or palpitations GI ROS: Positive for nausea, abdominal pain, constipation Musculoskeletal ROS: negative for - joint swelling or muscle pain Neurological ROS: negative for - confusion, syncope Dermatological ROS: negative for pruritus and rash Psychiatric: negative for anxiety, depression, difficulty sleeping and memory loss  MEDICATIONS: Current Outpatient Medications  Medication Sig Dispense Refill   albuterol 90 mcg/actuation inhaler Inhale 2 inhalations into the lungs every 6 (six) hours as needed for Wheezing 1 each 11   cholecalciferol (VITAMIN D3) 400 unit tablet Take 800 Units by mouth once daily     famotidine (PEPCID) 40 MG tablet Take 1 tablet (40 mg total) by mouth nightly 90 tablet 2   fluticasone furoate-vilanteroL (BREO ELLIPTA) 100-25 mcg/dose DsDv inhaler Inhale 1 inhalation into the lungs once daily 3 each 3   fluticasone propionate (FLONASE) 50 mcg/actuation nasal spray USE 2 SPRAYS IN EACH NOSTRIL ONE TIME DAILY 48 g 2   HYDROcodone-acetaminophen (NORCO) 5-325 mg tablet Take 1 tablet by mouth 2 (two) times daily     ipratropium-albuteroL (DUO-NEB) nebulizer solution Take by nebulization as needed     lithium carbonate 300 MG capsule Take 300 mg by mouth 2 (two)  times daily with meals        mometasone (ELOCON) 0.1 % cream once daily     montelukast (SINGULAIR) 10 mg tablet Take 1 tablet (10 mg total) by mouth once daily 90 tablet 3   PARoxetine (PAXIL) 30 MG tablet once daily     QUEtiapine (SEROQUEL) 100 MG tablet Take 100 mg by mouth once daily Take with 50 mg tablet.     QUEtiapine (SEROQUEL) 25 MG tablet Take 1 tablet (25 mg total) by mouth nightly.  (Patient taking differently: 50 mg Takes 50 mg along with 100 mg daily takes 25 extra as needed.) 90 tablet 3   semaglutide (OZEMPIC) 0.25 mg or 0.5 mg(2 mg/1.5 mL) pen injector Inject 0.375 mLs (0.5 mg total) subcutaneously once a week for 30 days 0.75 mL 5   pantoprazole (PROTONIX) 40 MG DR tablet Take 1 tablet (40 mg total) by mouth once daily (Patient not taking: Reported on 04/01/2021) 90 tablet 3   rosuvastatin (CRESTOR) 10 MG tablet Take 1 tablet (10 mg total) by mouth once daily (Patient not taking: Reported on 04/01/2021) 30 tablet 11   No current facility-administered medications for this visit.    ALLERGIES: Patient has no known allergies.  PAST MEDICAL HISTORY: Past Medical History:  Diagnosis Date   Aortic atherosclerosis (CMS-HCC)    Bipolar and related disorder (CMS-HCC)    Cancer (CMS-HCC)    COPD mixed type , unspecified (CMS-HCC)    GERD (gastroesophageal reflux disease)    Incontinence of urine    Left breast CA (CMS-HCC) 2005   Osteoporosis    Shingles 04/2016   subclinical hyperthyroidism    Substance abuse (CMS-HCC) 1976   smoking   Tobacco abuse    Type 2 diabetes mellitus with hyperlipidemia (CMS-HCC) 12/30/2020    PAST SURGICAL HISTORY: Past Surgical History:  Procedure Laterality Date   ARTHROPLASTY TOTAL SHOULDER Left 04/08/2016   Procedure: LEFT OPEN REVISION TOTAL SHOULDER REPLACEMENT, ROTATOR CUFF REPAIR,;  Surgeon: Genia Plants, MD;  Location: [facility];  Service: Orthopedics;  Laterality: Left;   ARTHROSCOPIC SUBACROMIAL DECOMP Left 02/07/2016   Procedure: ARTHROSCOPY, SHOULDER, SURGICAL; DECOMPRESSION SUBACROMIAL SPACE W/PARTIAL ACROMIOPLASTY, W/CORACOACROMIAL LIGAMENT RELEASE, WHEN PERFORMED (LIST IN ADDITION TO PRIMARY PROCEDURE);  Surgeon: Genia Plants, MD;  Location: [facility];  Service: Orthopedics;  Laterality: Left;   ARTHROSCOPY SHOULDER W/DEBRIDEMENT Left 02/07/2016   Procedure: LEFT SHOULDER ARTHROSCOPY, ARTHROSCOPIC SUBACROMIAL  DECOMPRESSION, CAPSULAR RELEASE, TENDON DEBRIDEMENT, JOINT DEBRIDEMENT ;  Surgeon: Genia Plants, MD;  Location: [facility];  Service: Orthopedics;  Laterality: Left;   COLONOSCOPY  11/11/2006 [facility]   Hyperplastic Polyp/Repeat 20yrs/[facility]   COLONOSCOPY  03/23/2017   Tubular adenoma of the colon/Repeat 37yrs/[facility]   cyst removed from back     EGD  02/24/12, 11/11/2006, 03/04/2001, 06/17/1998   Reflux Esophagitis.Marland KitchenNo Repeat/[facility]   EGD  03/23/2017   Normal EGD biopsy/No Repeat/[facility]   JOINT REPLACEMENT Left    x3 triangle orthopedics   MASTECTOMY     MASTECTOMY MODIFIED RADICAL Bilateral    with reconstruction and silicone implants   REPAIR ROTATOR CUFF TEAR CHRONIC OPEN Left 04/08/2016   Procedure: REPAIR OF RUPTURED MUSCULOTENDINOUS CUFF (EG, ROTATOR CUFF) OPEN; CHRONIC;  Surgeon: Genia Plants, MD;  Location: [facility];  Service: Orthopedics;  Laterality: Left;   skin lesion removal Right 09/2014   non canerous, right breast   TONSILLECTOMY     TUBAL LIGATION     vocal cord resection     dr Tami Ribas 2006  FAMILY HISTORY: Family History  Problem Relation Age of Onset   Lupus Mother    Arthritis Mother    COPD Mother    Throat cancer Father    Prostate cancer Father    Myocardial Infarction (Heart attack) Father    Thyroid disease Father    Esophageal cancer Father    COPD Sister      SOCIAL HISTORY: Social History   Socioeconomic History   Marital status: Married  Tobacco Use   Smoking status: Former Smoker    Packs/day: 1.00    Years: 30.00    Pack years: 30.00    Start date: 11/15/1973    Quit date: 01/20/2016    Years since quitting: 5.2   Smokeless tobacco: Never Used   Tobacco comment: Pt currently uses vapor cigarettes  Vaping Use   Vaping Use: Every day  Substance and Sexual Activity   Alcohol use: Never    Alcohol/week: 0.0 standard drinks   Drug use: No   Sexual activity: Yes    Partners: Male    Comment: Husband    PHYSICAL EXAM: Vitals:   04/01/21 1436   BP: 125/81  Pulse: 70   Body mass index is 37.03 kg/m. Weight: 88.9 kg (196 lb)   GENERAL: Alert, active, oriented x3  HEENT: Pupils equal reactive to light. Extraocular movements are intact. Sclera clear. Palpebral conjunctiva normal red color.Pharynx clear.  NECK: Supple with no palpable mass and no adenopathy.  LUNGS: Sound clear with no rales rhonchi or wheezes.  HEART: Regular rhythm S1 and S2 without murmur.  ABDOMEN: Soft and depressible, nontender with no palpable mass, no hepatomegaly.   EXTREMITIES: Well-developed well-nourished symmetrical with no dependent edema.  NEUROLOGICAL: Awake alert oriented, facial expression symmetrical, moving all extremities.  REVIEW OF DATA: I have reviewed the following data today: Office Visit on 03/21/2021  Component Date Value   WBC (White Blood Cell Co* 03/21/2021 7.5    RBC (Red Blood Cell Coun* 03/21/2021 4.75    Hemoglobin 03/21/2021 13.7    Hematocrit 03/21/2021 42.8    MCV (Mean Corpuscular Vo* 03/21/2021 90.1    MCH (Mean Corpuscular He* 03/21/2021 28.8    MCHC (Mean Corpuscular H* 03/21/2021 32.0    Platelet Count 03/21/2021 370    RDW-CV (Red Cell Distrib* 03/21/2021 13.2    MPV (Mean Platelet Volum* 03/21/2021 9.4    Neutrophils 03/21/2021 5.39    Lymphocytes 03/21/2021 1.27    Monocytes 03/21/2021 0.45    Eosinophils 03/21/2021 0.29    Basophils 03/21/2021 0.04    Neutrophil % 03/21/2021 72.5 (!)   Lymphocyte % 03/21/2021 17.0    Monocyte % 03/21/2021 6.0    Eosinophil % 03/21/2021 3.9    Basophil% 03/21/2021 0.5    Immature Granulocyte % 03/21/2021 0.1    Immature Granulocyte Cou* 03/21/2021 0.01    Prothrombin Time 03/21/2021 13.7    Prothrombin INR 03/21/2021 1.2 (!)   aPTT - [facility] 03/21/2021 26      ASSESSMENT: Ms. Kintz is a 65 y.o. female presenting for consultation for cholelithiasis.    Patient was oriented about the diagnosis of cholelithiasis / biliary dyskinesia. Also oriented about what  is the gallbladder, its anatomy and function and the implications of having stones. The patient was oriented about the treatment alternatives (observation vs cholecystectomy). Patient was oriented that a low percentage of patient will continue to have similar pain symptoms even after the gallbladder is removed. Surgical technique (open vs laparoscopic) was discussed. It was also discussed  the goals of the surgery (decrease the pain episodes and avoid the risk of cholecystitis) and the risk of surgery including: bleeding, infection, common bile duct injury, stone retention, injury to other organs such as bowel, liver, stomach, other complications such as hernia, bowel obstruction among others. Also discussed with patient about anesthesia and its complications such as: reaction to medications, pneumonia, heart complications, death, among others.   Due to her multiple nonspecific abdominal complaints I think that is reasonable to keep the appointment for the upper endoscopy and colonoscopy.  They will get a complete evaluation of other causes of abdominal pain as well.  I do think the patient would benefit a cholecystectomy due to the size of the gallstones on the right upper quadrant pain.  I do think that this will resolve all her abdominal issues but she will have some relief of some of the pains as well.  The patient endorses she understood these recommendations and agreed to proceed with cholecystectomy.  Cholelithiasis without cholecystitis [K80.20]  PLAN: 1.  Robotic assisted laparoscopic cholecystectomy (37543) 2.  Do not take aspirin 5 days before the procedure 3.  Contact us if has any question or concern.

## 2021-04-09 ENCOUNTER — Encounter
Admission: RE | Admit: 2021-04-09 | Discharge: 2021-04-09 | Disposition: A | Discharge status: Home / Self Care | Payer: Medicare HMO | Source: Ambulatory Visit | Attending: General Surgery | Admitting: General Surgery

## 2021-04-09 ENCOUNTER — Other Ambulatory Visit: Payer: Self-pay

## 2021-04-09 DIAGNOSIS — Z01818 Encounter for other preprocedural examination: Secondary | ICD-10-CM

## 2021-04-09 DIAGNOSIS — R7303 Prediabetes: Secondary | ICD-10-CM

## 2021-04-09 HISTORY — DX: Type 2 diabetes mellitus without complications: E11.9

## 2021-04-09 HISTORY — DX: Anxiety disorder, unspecified: F41.9

## 2021-04-09 HISTORY — DX: Personal history of other diseases of the digestive system: Z87.19

## 2021-04-09 HISTORY — DX: Hypothyroidism, unspecified: E03.9

## 2021-04-09 HISTORY — DX: Unspecified asthma, uncomplicated: J45.909

## 2021-04-09 HISTORY — DX: Dyspnea, unspecified: R06.00

## 2021-04-09 HISTORY — DX: Family history of other specified conditions: Z84.89

## 2021-04-09 HISTORY — DX: Other complications of anesthesia, initial encounter: T88.59XA

## 2021-04-09 HISTORY — DX: Chronic obstructive pulmonary disease, unspecified: J44.9

## 2021-04-09 NOTE — Patient Instructions (Signed)
Your procedure is scheduled on:04-16-21 Wednesday Report to the Registration Desk on the 1st floor of the [facility].Then proceed to the 2nd floor Surgery Desk in the [facility] To find out your arrival time, please call [phone] between 1PM - 3PM on:04-15-21 Tuesday  REMEMBER: Instructions that are not followed completely may result in serious medical risk, up to and including death; or upon the discretion of your surgeon and anesthesiologist your surgery may need to be rescheduled.  Do not eat food after midnight the night before surgery.  No gum chewing, lozengers or hard candies.  You may however, drink CLEAR liquids up to 2 hours before you are scheduled to arrive for your surgery. Do not drink anything within 2 hours of your scheduled arrival time.  Clear liquids include: - water  - apple juice without pulp - gatorade (not RED, PURPLE, OR BLUE) - black coffee or tea (Do NOT add milk or creamers to the coffee or tea) Do NOT drink anything that is not on this list.  TAKE THESE MEDICATIONS THE MORNING OF SURGERY WITH A SIP OF WATER: -pantoprazole (PROTONIX) 40 MG tablet lithium carbonate 300 MG capsule -lithium carbonate 300 MG capsule -PARoxetine (PAXIL) 30 MG tablet  Use your albuterol (PROVENTIL HFA;VENTOLIN HFA) 108 (90 Base) MCG/ACT inhaler the morning of surgery and bring inhaler to the hospital  One week prior to surgery: Stop Anti-inflammatories (NSAIDS) such as Advil, Aleve, Ibuprofen, Motrin, Naproxen, Naprosyn and Aspirin based products such as Excedrin, Goodys Powder, BC Powder.You may however, continue to take Tylenol/HYDROcodone-acetaminophen (NORCO/VICODIN) 5-325 MG tablet if needed for pain up until the day of surgery.  Stop ANY OVER THE COUNTER supplements/vitamins NOW (04-09-21) until after surgery (Vitamin D, Cholecalciferol, 10 MCG (400 UNIT) CAPS)  No Alcohol for 24 hours before or after surgery.  No Smoking including e-cigarettes for 24 hours prior  to surgery.  No chewable tobacco products for at least 6 hours prior to surgery.  No nicotine patches on the day of surgery.  Do not use any "recreational" drugs for at least a week prior to your surgery.  Please be advised that the combination of cocaine and anesthesia may have negative outcomes, up to and including death. If you test positive for cocaine, your surgery will be cancelled.  On the morning of surgery brush your teeth with toothpaste and water, you may rinse your mouth with mouthwash if you wish. Do not swallow any toothpaste or mouthwash.  Use CHG Soap as directed on instruction sheet.  Do not wear jewelry, make-up, hairpins, clips or nail polish.  Do not wear lotions, powders, or perfumes.   Do not shave body from the neck down 48 hours prior to surgery just in case you cut yourself which could leave a site for infection.  Also, freshly shaved skin may become irritated if using the CHG soap.  Contact lenses, hearing aids and dentures may not be worn into surgery.  Do not bring valuables to the hospital. [facility] is not responsible for any missing/lost belongings or valuables.   Notify your doctor if there is any change in your medical condition (cold, fever, infection).  Wear comfortable clothing (specific to your surgery type) to the hospital.  After surgery, you can help prevent lung complications by doing breathing exercises.  Take deep breaths and cough every 1-2 hours. Your doctor may order a device called an Incentive Spirometer to help you take deep breaths. When coughing or sneezing, hold a pillow firmly against your incision  with both hands. This is called "splinting." Doing this helps protect your incision. It also decreases belly discomfort.  If you are being admitted to the hospital overnight, leave your suitcase in the car. After surgery it may be brought to your room.  If you are being discharged the day of surgery, you will not be allowed to drive  home. You will need a responsible adult (18 years or older) to drive you home and stay with you that night.   If you are taking public transportation, you will need to have a responsible adult (18 years or older) with you. Please confirm with your physician that it is acceptable to use public transportation.   Please call the [facility] at [phone] if you have any questions about these instructions.  Surgery Visitation Policy:  Patients undergoing a surgery or procedure may have one family member or support person with them as long as that person is not COVID-19 positive or experiencing its symptoms.  That person may remain in the waiting area during the procedure and may rotate out with other people.  Inpatient Visitation:    Visiting hours are 7 a.m. to 8 p.m. Up to two visitors ages 16+ are allowed at one time in a patient room. The visitors may rotate out with other people during the day. Visitors must check out when they leave, or other visitors will not be allowed. One designated support person may remain overnight. The visitor must pass COVID-19 screenings, use hand sanitizer when entering and exiting the patient's room and wear a mask at all times, including in the patient's room. Patients must also wear a mask when staff or their visitor are in the room. Masking is required regardless of vaccination status.

## 2021-04-10 DIAGNOSIS — H60543 Acute eczematoid otitis externa, bilateral: Secondary | ICD-10-CM | POA: Diagnosis not present

## 2021-04-10 DIAGNOSIS — D65 Disseminated intravascular coagulation [defibrination syndrome]: Secondary | ICD-10-CM | POA: Diagnosis not present

## 2021-04-11 ENCOUNTER — Other Ambulatory Visit: Payer: Self-pay

## 2021-04-11 ENCOUNTER — Encounter
Admission: RE | Admit: 2021-04-11 | Discharge: 2021-04-11 | Disposition: A | Discharge status: Home / Self Care | Payer: Medicare HMO | Source: Ambulatory Visit | Attending: General Surgery | Admitting: General Surgery

## 2021-04-11 DIAGNOSIS — J449 Chronic obstructive pulmonary disease, unspecified: Secondary | ICD-10-CM | POA: Diagnosis not present

## 2021-04-11 DIAGNOSIS — R7303 Prediabetes: Secondary | ICD-10-CM | POA: Insufficient documentation

## 2021-04-11 DIAGNOSIS — Z01818 Encounter for other preprocedural examination: Secondary | ICD-10-CM

## 2021-04-11 LAB — BASIC METABOLIC PANEL
Anion gap: 6 (ref 5–15)
BUN: 12 mg/dL (ref 8–23)
CO2: 26 mmol/L (ref 22–32)
Calcium: 10.2 mg/dL (ref 8.9–10.3)
Chloride: 105 mmol/L (ref 98–111)
Creatinine, Ser: 1.05 mg/dL — ABNORMAL HIGH (ref 0.44–1.00)
GFR, Estimated: 60 mL/min — ABNORMAL LOW (ref >60)
Glucose, Bld: 129 mg/dL — ABNORMAL HIGH (ref 70–99)
Potassium: 4 mmol/L (ref 3.5–5.1)
Sodium: 137 mmol/L (ref 135–145)

## 2021-04-15 DIAGNOSIS — D123 Benign neoplasm of transverse colon: Secondary | ICD-10-CM | POA: Diagnosis not present

## 2021-04-15 DIAGNOSIS — K2950 Unspecified chronic gastritis without bleeding: Secondary | ICD-10-CM | POA: Diagnosis not present

## 2021-04-15 DIAGNOSIS — Z86010 Personal history of colonic polyps: Secondary | ICD-10-CM | POA: Diagnosis not present

## 2021-04-15 DIAGNOSIS — D124 Benign neoplasm of descending colon: Secondary | ICD-10-CM | POA: Diagnosis not present

## 2021-04-15 DIAGNOSIS — K640 First degree hemorrhoids: Secondary | ICD-10-CM | POA: Diagnosis not present

## 2021-04-15 DIAGNOSIS — D126 Benign neoplasm of colon, unspecified: Secondary | ICD-10-CM | POA: Diagnosis not present

## 2021-04-15 DIAGNOSIS — D122 Benign neoplasm of ascending colon: Secondary | ICD-10-CM | POA: Diagnosis not present

## 2021-04-15 DIAGNOSIS — R1310 Dysphagia, unspecified: Secondary | ICD-10-CM | POA: Diagnosis not present

## 2021-04-15 DIAGNOSIS — D128 Benign neoplasm of rectum: Secondary | ICD-10-CM | POA: Diagnosis not present

## 2021-04-16 ENCOUNTER — Other Ambulatory Visit: Payer: Self-pay

## 2021-04-16 ENCOUNTER — Ambulatory Visit: Payer: Medicare HMO | Admitting: Registered Nurse

## 2021-04-16 ENCOUNTER — Encounter: Payer: Self-pay | Admitting: General Surgery

## 2021-04-16 ENCOUNTER — Ambulatory Visit
Admission: RE | Admit: 2021-04-16 | Discharge: 2021-04-16 | Disposition: A | Discharge status: Home / Self Care | Payer: Medicare HMO | Attending: General Surgery | Admitting: General Surgery

## 2021-04-16 ENCOUNTER — Encounter
Admission: RE | Disposition: A | Discharge status: Home / Self Care | Payer: Self-pay | Source: Home / Self Care | Attending: General Surgery

## 2021-04-16 DIAGNOSIS — E1169 Type 2 diabetes mellitus with other specified complication: Secondary | ICD-10-CM | POA: Insufficient documentation

## 2021-04-16 DIAGNOSIS — K8010 Calculus of gallbladder with chronic cholecystitis without obstruction: Secondary | ICD-10-CM | POA: Diagnosis not present

## 2021-04-16 DIAGNOSIS — K8020 Calculus of gallbladder without cholecystitis without obstruction: Secondary | ICD-10-CM | POA: Diagnosis not present

## 2021-04-16 DIAGNOSIS — Z87891 Personal history of nicotine dependence: Secondary | ICD-10-CM | POA: Insufficient documentation

## 2021-04-16 DIAGNOSIS — E785 Hyperlipidemia, unspecified: Secondary | ICD-10-CM | POA: Diagnosis not present

## 2021-04-16 LAB — GLUCOSE, CAPILLARY
Glucose-Capillary: 111 mg/dL — ABNORMAL HIGH (ref 70–99)
Glucose-Capillary: 155 mg/dL — ABNORMAL HIGH (ref 70–99)

## 2021-04-16 SURGERY — CHOLECYSTECTOMY, ROBOT-ASSISTED, LAPAROSCOPIC
Anesthesia: General | Site: Abdomen

## 2021-04-16 MED ORDER — KETAMINE HCL 10 MG/ML IJ SOLN
INTRAMUSCULAR | Status: DC | PRN
  Administered 2021-04-16: 30 mg via INTRAVENOUS

## 2021-04-16 MED ORDER — ACETAMINOPHEN 10 MG/ML IV SOLN: 1000.0000 mg | Freq: Once | INTRAVENOUS | Status: DC | PRN

## 2021-04-16 MED ORDER — SODIUM CHLORIDE 0.9 % IV SOLN: INTRAVENOUS | Status: DC

## 2021-04-16 MED ORDER — LIDOCAINE HCL (CARDIAC) PF 100 MG/5ML IV SOSY
PREFILLED_SYRINGE | INTRAVENOUS | Status: DC | PRN
  Administered 2021-04-16: 50 mg via INTRAVENOUS

## 2021-04-16 MED ORDER — INDOCYANINE GREEN 25 MG IV SOLR
1.2500 mg | Freq: Once | INTRAVENOUS | Status: AC
  Administered 2021-04-16: 1.25 mg via INTRAVENOUS
  Filled 2021-04-16: qty 0.5

## 2021-04-16 MED ORDER — SODIUM CHLORIDE 0.9 % IR SOLN
Status: DC | PRN
  Administered 2021-04-16: 500 mL

## 2021-04-16 MED ORDER — FENTANYL CITRATE (PF) 100 MCG/2ML IJ SOLN
INTRAMUSCULAR | Status: DC | PRN
  Administered 2021-04-16 (×2): 50 ug via INTRAVENOUS

## 2021-04-16 MED ORDER — DEXAMETHASONE SODIUM PHOSPHATE 10 MG/ML IJ SOLN
INTRAMUSCULAR | Status: DC | PRN
  Administered 2021-04-16: 10 mg via INTRAVENOUS

## 2021-04-16 MED ORDER — BUPIVACAINE-EPINEPHRINE (PF) 0.25% -1:200000 IJ SOLN
INTRAMUSCULAR | Status: AC
  Filled 2021-04-16: qty 30

## 2021-04-16 MED ORDER — FENTANYL CITRATE (PF) 100 MCG/2ML IJ SOLN
INTRAMUSCULAR | Status: AC
  Administered 2021-04-16: 50 ug via INTRAVENOUS
  Filled 2021-04-16: qty 2

## 2021-04-16 MED ORDER — LACTATED RINGERS IV SOLN: INTRAVENOUS | Status: DC

## 2021-04-16 MED ORDER — PROPOFOL 10 MG/ML IV BOLUS
INTRAVENOUS | Status: AC
  Filled 2021-04-16: qty 20

## 2021-04-16 MED ORDER — ACETAMINOPHEN 10 MG/ML IV SOLN
INTRAVENOUS | Status: AC
  Filled 2021-04-16: qty 100

## 2021-04-16 MED ORDER — PROPOFOL 10 MG/ML IV BOLUS
INTRAVENOUS | Status: DC | PRN
  Administered 2021-04-16: 160 mg via INTRAVENOUS

## 2021-04-16 MED ORDER — SUGAMMADEX SODIUM 200 MG/2ML IV SOLN
INTRAVENOUS | Status: DC | PRN
  Administered 2021-04-16: 200 mg via INTRAVENOUS

## 2021-04-16 MED ORDER — ONDANSETRON HCL 4 MG/2ML IJ SOLN
INTRAMUSCULAR | Status: DC | PRN
  Administered 2021-04-16: 4 mg via INTRAVENOUS

## 2021-04-16 MED ORDER — BUPIVACAINE-EPINEPHRINE 0.25% -1:200000 IJ SOLN
INTRAMUSCULAR | Status: DC | PRN
  Administered 2021-04-16: 25 mL

## 2021-04-16 MED ORDER — CHLORHEXIDINE GLUCONATE 0.12 % MT SOLN: 15.0000 mL | Freq: Once | OROMUCOSAL | Status: DC

## 2021-04-16 MED ORDER — DEXMEDETOMIDINE (PRECEDEX) IN NS 20 MCG/5ML (4 MCG/ML) IV SYRINGE
PREFILLED_SYRINGE | INTRAVENOUS | Status: DC | PRN
  Administered 2021-04-16 (×2): 10 ug via INTRAVENOUS

## 2021-04-16 MED ORDER — OXYCODONE HCL 5 MG/5ML PO SOLN
5.0000 mg | Freq: Once | ORAL | Status: AC | PRN
  Administered 2021-04-16: 5 mg via ORAL

## 2021-04-16 MED ORDER — HYDROCODONE-ACETAMINOPHEN 5-325 MG PO TABS: 1.0000 | ORAL_TABLET | ORAL | 0 refills | Status: AC | PRN

## 2021-04-16 MED ORDER — CHLORHEXIDINE GLUCONATE 0.12 % MT SOLN
OROMUCOSAL | Status: AC
  Filled 2021-04-16: qty 15

## 2021-04-16 MED ORDER — ONDANSETRON HCL 4 MG/2ML IJ SOLN: 4.0000 mg | Freq: Once | INTRAMUSCULAR | Status: DC | PRN

## 2021-04-16 MED ORDER — FENTANYL CITRATE (PF) 100 MCG/2ML IJ SOLN
25.0000 ug | INTRAMUSCULAR | Status: DC | PRN
  Administered 2021-04-16 (×2): 25 ug via INTRAVENOUS

## 2021-04-16 MED ORDER — ORAL CARE MOUTH RINSE: 15.0000 mL | Freq: Once | OROMUCOSAL | Status: DC

## 2021-04-16 MED ORDER — CEFAZOLIN SODIUM-DEXTROSE 2-4 GM/100ML-% IV SOLN
INTRAVENOUS | Status: AC
  Filled 2021-04-16: qty 100

## 2021-04-16 MED ORDER — DEXMEDETOMIDINE (PRECEDEX) IN NS 20 MCG/5ML (4 MCG/ML) IV SYRINGE
PREFILLED_SYRINGE | INTRAVENOUS | Status: AC
  Filled 2021-04-16: qty 5

## 2021-04-16 MED ORDER — MIDAZOLAM HCL 2 MG/2ML IJ SOLN
INTRAMUSCULAR | Status: DC | PRN
  Administered 2021-04-16: 2 mg via INTRAVENOUS

## 2021-04-16 MED ORDER — MIDAZOLAM HCL 2 MG/2ML IJ SOLN
INTRAMUSCULAR | Status: AC
  Filled 2021-04-16: qty 2

## 2021-04-16 MED ORDER — FENTANYL CITRATE (PF) 100 MCG/2ML IJ SOLN
INTRAMUSCULAR | Status: AC
  Filled 2021-04-16: qty 2

## 2021-04-16 MED ORDER — ACETAMINOPHEN 10 MG/ML IV SOLN
INTRAVENOUS | Status: DC | PRN
  Administered 2021-04-16: 1000 mg via INTRAVENOUS

## 2021-04-16 MED ORDER — KETAMINE HCL 50 MG/5ML IJ SOSY
PREFILLED_SYRINGE | INTRAMUSCULAR | Status: AC
  Filled 2021-04-16: qty 5

## 2021-04-16 MED ORDER — CEFAZOLIN SODIUM-DEXTROSE 2-4 GM/100ML-% IV SOLN
2.0000 g | INTRAVENOUS | Status: AC
  Administered 2021-04-16: 2 g via INTRAVENOUS

## 2021-04-16 MED ORDER — OXYCODONE HCL 5 MG PO TABS
ORAL_TABLET | ORAL | Status: AC
  Filled 2021-04-16: qty 1

## 2021-04-16 MED ORDER — ROCURONIUM BROMIDE 100 MG/10ML IV SOLN
INTRAVENOUS | Status: DC | PRN
  Administered 2021-04-16: 50 mg via INTRAVENOUS

## 2021-04-16 MED ORDER — OXYCODONE HCL 5 MG PO TABS: 5.0000 mg | ORAL_TABLET | Freq: Once | ORAL | Status: AC | PRN

## 2021-04-16 SURGICAL SUPPLY — 53 items
ADH SKN CLS APL DERMABOND .7 (GAUZE/BANDAGES/DRESSINGS) ×2
APL PRP STRL LF DISP 70% ISPRP (MISCELLANEOUS)
BAG INFUSER PRESSURE 100CC (MISCELLANEOUS) IMPLANT
BAG SPEC RTRVL LRG 6X4 10 (ENDOMECHANICALS) ×2
BLADE SURG SZ11 CARB STEEL (BLADE) ×3 IMPLANT
CANNULA REDUC XI 12-8 STAPL (CANNULA) ×3
CANNULA REDUCER 12-8 DVNC XI (CANNULA) ×2 IMPLANT
CHLORAPREP W/TINT 26 (MISCELLANEOUS) IMPLANT
CLIP LIGATING HEMO O LOK GREEN (MISCELLANEOUS) ×3 IMPLANT
DECANTER SPIKE VIAL GLASS SM (MISCELLANEOUS) IMPLANT
DEFOGGER SCOPE WARMER CLEARIFY (MISCELLANEOUS) ×3 IMPLANT
DERMABOND ADVANCED (GAUZE/BANDAGES/DRESSINGS) ×1
DERMABOND ADVANCED .7 DNX12 (GAUZE/BANDAGES/DRESSINGS) ×2 IMPLANT
DRAPE ARM DVNC X/XI (DISPOSABLE) ×8 IMPLANT
DRAPE COLUMN DVNC XI (DISPOSABLE) ×2 IMPLANT
DRAPE DA VINCI XI ARM (DISPOSABLE) ×12
DRAPE DA VINCI XI COLUMN (DISPOSABLE) ×3
ELECT REM PT RETURN 9FT ADLT (ELECTROSURGICAL) ×3
ELECTRODE REM PT RTRN 9FT ADLT (ELECTROSURGICAL) ×2 IMPLANT
GAUZE 4X4 16PLY ~~LOC~~+RFID DBL (SPONGE) ×3 IMPLANT
GLOVE SURG ENC MOIS LTX SZ6.5 (GLOVE) ×6 IMPLANT
GLOVE SURG UNDER POLY LF SZ6.5 (GLOVE) ×6 IMPLANT
GOWN STRL REUS W/ TWL LRG LVL3 (GOWN DISPOSABLE) ×6 IMPLANT
GOWN STRL REUS W/TWL LRG LVL3 (GOWN DISPOSABLE) ×9
GRASPER SUT TROCAR 14GX15 (MISCELLANEOUS) ×3 IMPLANT
IRRIGATOR SUCT 8 DISP DVNC XI (IRRIGATION / IRRIGATOR) IMPLANT
IRRIGATOR SUCTION 8MM XI DISP (IRRIGATION / IRRIGATOR)
IV NS 1000ML (IV SOLUTION)
IV NS 1000ML BAXH (IV SOLUTION) IMPLANT
KIT PINK PAD W/HEAD ARE REST (MISCELLANEOUS) ×3
KIT PINK PAD W/HEAD ARM REST (MISCELLANEOUS) ×2 IMPLANT
LABEL OR SOLS (LABEL) ×3 IMPLANT
MANIFOLD NEPTUNE II (INSTRUMENTS) IMPLANT
NEEDLE HYPO 22GX1.5 SAFETY (NEEDLE) ×3 IMPLANT
NEEDLE INSUFFLATION 14GA 120MM (NEEDLE) ×3 IMPLANT
NS IRRIG 500ML POUR BTL (IV SOLUTION) ×3 IMPLANT
OBTURATOR OPTICAL STANDARD 8MM (TROCAR) ×3
OBTURATOR OPTICAL STND 8 DVNC (TROCAR) ×2
OBTURATOR OPTICALSTD 8 DVNC (TROCAR) ×2 IMPLANT
PACK LAP CHOLECYSTECTOMY (MISCELLANEOUS) ×3 IMPLANT
POUCH SPECIMEN RETRIEVAL 10MM (ENDOMECHANICALS) ×3 IMPLANT
SEAL CANN UNIV 5-8 DVNC XI (MISCELLANEOUS) ×6 IMPLANT
SEAL XI 5MM-8MM UNIVERSAL (MISCELLANEOUS) ×9
SET TUBE SMOKE EVAC HIGH FLOW (TUBING) ×3 IMPLANT
SOLUTION ELECTROLUBE (MISCELLANEOUS) ×3 IMPLANT
SPONGE T-LAP 4X18 ~~LOC~~+RFID (SPONGE) IMPLANT
STAPLER CANNULA SEAL DVNC XI (STAPLE) ×2 IMPLANT
STAPLER CANNULA SEAL XI (STAPLE) ×3
SUT MNCRL 4-0 (SUTURE) ×3
SUT MNCRL 4-0 27XMFL (SUTURE) ×2
SUT VICRYL 0 AB UR-6 (SUTURE) ×3 IMPLANT
SUTURE MNCRL 4-0 27XMF (SUTURE) ×2 IMPLANT
WATER STERILE IRR 500ML POUR (IV SOLUTION) IMPLANT

## 2021-04-16 NOTE — Discharge Instructions (Addendum)
  Diet: Resume home heart healthy regular diet.   Activity: No heavy lifting >20 pounds (children, pets, laundry, garbage) or strenuous activity until follow-up, but light activity and walking are encouraged. Do not drive or drink alcohol if taking narcotic pain medications.  Wound care: May shower with soapy water and pat dry (do not rub incisions), but no baths or submerging incision underwater until follow-up. (no swimming)   Medications: Resume all home medications. For mild to moderate pain: acetaminophen (Tylenol) ***or ibuprofen (if no kidney disease). Combining Tylenol with alcohol can substantially increase your risk of causing liver disease. Narcotic pain medications, if prescribed, can be used for severe pain, though may cause nausea, constipation, and drowsiness. Do not combine Tylenol and Norco within a 6 hour period as Norco contains Tylenol. If you do not need the narcotic pain medication, you do not need to fill the prescription.  Call office ([phone]) at any time if any questions, worsening pain, fevers/chills, bleeding, drainage from incision site, or other concerns.   AMBULATORY SURGERY  DISCHARGE INSTRUCTIONS   The drugs that you were given will stay in your system until tomorrow so for the next 24 hours you should not:  Drive an automobile Make any legal decisions Drink any alcoholic beverage   You may resume regular meals tomorrow.  Today it is better to start with liquids and gradually work up to solid foods.  You may eat anything you prefer, but it is better to start with liquids, then soup and crackers, and gradually work up to solid foods.   Please notify your doctor immediately if you have any unusual bleeding, trouble breathing, redness and pain at the surgery site, drainage, fever, or pain not relieved by medication.    Additional Instructions:        Please contact your physician with any problems or Same Day Surgery at [phone], Monday  through Friday 6 am to 4 pm, or [facility] Main number at [phone].  

## 2021-04-16 NOTE — Op Note (Signed)
Preoperative diagnosis: Cholelithiasis  Postoperative diagnosis: Cholelithiasis   Procedure: Robotic Assisted Laparoscopic Cholecystectomy.   Anesthesia: GETA   Surgeon: Dr. Windell Moment  Wound Classification: Clean Contaminated  Indications: Patient is a 65 y.o. female developed right upper quadrant pain and on workup was found to have cholelithiasis with a normal common duct. Robotic Assisted Laparoscopic cholecystectomy was elected.  Findings:  Critical view of safety achieved Cystic duct and artery identified, ligated and divided Adequate hemostasis   Description of procedure: The patient was placed on the operating table in the supine position. General anesthesia was induced. A time-out was completed verifying correct patient, procedure, site, positioning, and implant(s) and/or special equipment prior to beginning this procedure. An orogastric tube was placed. The abdomen was prepped and draped in the usual sterile fashion.  An incision was made in a natural skin line below the umbilicus.  The fascia was elevated and the Veress needle inserted. Proper position was confirmed by aspiration and saline meniscus test.  The abdomen was insufflated with carbon dioxide to a pressure of 15 mmHg. The patient tolerated insufflation well. A 8-mm trocar was then inserted in optiview fashion.  The laparoscope was inserted and the abdomen inspected. No injuries from initial trocar placement were noted. Additional trocars were then inserted in the following locations: an 8-mm trocar in the left lateral abdomen, and another two 8-mm trocars to the right side of the abdomen 5 cm appart. The umbilical trocar was changed to a 12 mm trocar all under direct visualization. The abdomen was inspected and no abnormalities were found. The table was placed in the reverse Trendelenburg position with the right side up. The robotic arms were docked and target anatomy identified. Instrument inserted under direct  visualization.  Filmy adhesions between the gallbladder and omentum, duodenum and transverse colon were lysed with electrocautery. The dome of the gallbladder was grasped with a prograsp and retracted over the dome of the liver. The infundibulum was also grasped with an atraumatic grasper and retracted toward the right lower quadrant. This maneuver exposed Calot's triangle. The peritoneum overlying the gallbladder infundibulum was then incised and the cystic duct and cystic artery identified and circumferentially dissected. Critical view of safety reviewed before ligating any structure. Firefly images taken to visualize biliary ducts. The cystic duct and cystic artery were then doubly clipped and divided close to the gallbladder.  The gallbladder was then dissected from its peritoneal attachments by electrocautery. Hemostasis was checked and the gallbladder and contained stones were removed using an endoscopic retrieval bag. The gallbladder was passed off the table as a specimen. There was no evidence of bleeding from the gallbladder fossa or cystic artery or leakage of the bile from the cystic duct stump. Secondary trocars were removed under direct vision. No bleeding was noted. The robotic arms were undoked. The scope was withdrawn and the umbilical trocar removed. The abdomen was allowed to collapse. The fascia of the 71mm trocar sites was closed with figure-of-eight 0 vicryl sutures. The skin was closed with subcuticular sutures of 4-0 monocryl and topical skin adhesive. The orogastric tube was removed.  The patient tolerated the procedure well and was taken to the postanesthesia care unit in stable condition.   Specimen: Gallbladder  Complications: None  EBL: 5 mL

## 2021-04-16 NOTE — Anesthesia Postprocedure Evaluation (Signed)
Anesthesia Post Note  Patient: KRISSIE MERRICK  Procedure(s) Performed: XI ROBOTIC ASSISTED LAPAROSCOPIC CHOLECYSTECTOMY (Abdomen) INDOCYANINE GREEN FLUORESCENCE IMAGING (ICG)  Patient location during evaluation: PACU Anesthesia Type: General Level of consciousness: awake and alert, awake and oriented Pain management: pain level controlled Vital Signs Assessment: post-procedure vital signs reviewed and stable Respiratory status: spontaneous breathing, nonlabored ventilation and respiratory function stable Cardiovascular status: blood pressure returned to baseline and stable Postop Assessment: no apparent nausea or vomiting Anesthetic complications: no   No notable events documented.   Last Vitals:  Vitals:   04/16/21 1315 04/16/21 1400  BP:    Pulse:    Resp:    Temp:  (!) 36.4 C  SpO2: 92%     Last Pain:  Vitals:   04/16/21 1300  TempSrc:   PainSc: 7                  Phill Mutter

## 2021-04-16 NOTE — Interval H&P Note (Signed)
History and Physical Interval Note:  04/16/2021 1:55 PM  Monica Hill  has presented today for surgery, with the diagnosis of K800.20 Cholelithiasis w/o cholecystitis.  The various methods of treatment have been discussed with the patient and family. After consideration of risks, benefits and other options for treatment, the patient has consented to  Procedure(s): XI ROBOTIC ASSISTED LAPAROSCOPIC CHOLECYSTECTOMY (N/A) [place] (ICG) (N/A) as a surgical intervention.  The patient's history has been reviewed, patient examined, no change in status, stable for surgery.  I have reviewed the patient's chart and labs.  Questions were answered to the patient's satisfaction.     Herbert Pun

## 2021-04-16 NOTE — Transfer of Care (Signed)
Immediate Anesthesia Transfer of Care Note  Patient: Monica Hill  Procedure(s) Performed: XI ROBOTIC ASSISTED LAPAROSCOPIC CHOLECYSTECTOMY (Abdomen) INDOCYANINE GREEN FLUORESCENCE IMAGING (ICG)  Patient Location: PACU  Anesthesia Type:General  Level of Consciousness: drowsy  Airway & Oxygen Therapy: Patient Spontanous Breathing and Patient connected to face mask oxygen  Post-op Assessment: Report given to RN  Post vital signs: stable  Last Vitals:  Vitals Value Taken Time  BP    Temp    Pulse 89 04/16/21 1248  Resp 20 04/16/21 1248  SpO2 99 % 04/16/21 1248  Vitals shown include unvalidated device data.  Last Pain:  Vitals:   04/16/21 0959  TempSrc: Tympanic  PainSc: 0-No pain         Complications: No notable events documented.

## 2021-04-16 NOTE — Anesthesia Preprocedure Evaluation (Addendum)
Anesthesia Evaluation  Patient identified by MRN, date of birth, ID band Patient awake    Reviewed: Allergy & Precautions, NPO status , Patient's Chart, lab work & pertinent test results  History of Anesthesia Complications Negative for: history of anesthetic complications  Airway Mallampati: II   Neck ROM: Full    Dental no notable dental hx.    Pulmonary COPD, former smoker (quit 2016),    Pulmonary exam normal breath sounds clear to auscultation       Cardiovascular Exercise Tolerance: Good Normal cardiovascular exam Rhythm:Regular Rate:Normal  ECG 04/11/21: normal  Stress echo 06/22/18:  Normal Stress Echocardiogram  NORMAL RIGHT VENTRICULAR SYSTOLIC FUNCTION  MILD VALVULAR REGURGITATION   NO VALVULAR STENOSIS NOTED    Neuro/Psych PSYCHIATRIC DISORDERS Depression Bipolar Disorder negative neurological ROS     GI/Hepatic hiatal hernia, GERD  ,  Endo/Other  diabetes, Type 2Obesity   Renal/GU negative Renal ROS     Musculoskeletal  (+) Arthritis ,   Abdominal   Peds  Hematology negative hematology ROS (+)   Anesthesia Other Findings   Reproductive/Obstetrics                            Anesthesia Physical Anesthesia Plan  ASA: 2  Anesthesia Plan: General   Post-op Pain Management:    Induction: Intravenous  PONV Risk Score and Plan: 3 and Ondansetron, Dexamethasone and Treatment may vary due to age or medical condition  Airway Management Planned: Oral ETT  Additional Equipment:   Intra-op Plan:   Post-operative Plan: Extubation in OR  Informed Consent: I have reviewed the patients History and Physical, chart, labs and discussed the procedure including the risks, benefits and alternatives for the proposed anesthesia with the patient or authorized representative who has indicated his/her understanding and acceptance.     Dental advisory given  Plan Discussed with:  CRNA  Anesthesia Plan Comments: (Patient consented for risks of anesthesia including but not limited to:  - adverse reactions to medications - damage to eyes, teeth, lips or other oral mucosa - nerve damage due to positioning  - sore throat or hoarseness - damage to heart, brain, nerves, lungs, other parts of body or loss of life  Informed patient about role of CRNA in peri- and intra-operative care.  Patient voiced understanding.)        Anesthesia Quick Evaluation

## 2021-04-16 NOTE — Anesthesia Procedure Notes (Signed)
Procedure Name: Intubation Date/Time: 04/16/2021 11:36 AM Performed by: Debe Coder, CRNA Pre-anesthesia Checklist: Patient identified, Emergency Drugs available, Suction available and Patient being monitored Patient Re-evaluated:Patient Re-evaluated prior to induction Oxygen Delivery Method: Circle system utilized Preoxygenation: Pre-oxygenation with 100% oxygen Induction Type: IV induction Ventilation: Mask ventilation without difficulty Laryngoscope Size: Mac and 3 Tube type: Oral Tube size: 6.5 mm Number of attempts: 1 Airway Equipment and Method: Stylet and Oral airway Placement Confirmation: ETT inserted through vocal cords under direct vision, positive ETCO2 and breath sounds checked- equal and bilateral Secured at: 21 cm Tube secured with: Tape Dental Injury: Teeth and Oropharynx as per pre-operative assessment

## 2021-04-18 LAB — SURGICAL PATHOLOGY

## 2021-05-07 DIAGNOSIS — E042 Nontoxic multinodular goiter: Secondary | ICD-10-CM | POA: Diagnosis not present

## 2021-05-07 DIAGNOSIS — E782 Mixed hyperlipidemia: Secondary | ICD-10-CM | POA: Diagnosis not present

## 2021-05-07 DIAGNOSIS — E210 Primary hyperparathyroidism: Secondary | ICD-10-CM | POA: Diagnosis not present

## 2021-05-07 DIAGNOSIS — E559 Vitamin D deficiency, unspecified: Secondary | ICD-10-CM | POA: Diagnosis not present

## 2021-05-12 DIAGNOSIS — E0590 Thyrotoxicosis, unspecified without thyrotoxic crisis or storm: Secondary | ICD-10-CM | POA: Diagnosis not present

## 2021-05-12 DIAGNOSIS — M816 Localized osteoporosis [Lequesne]: Secondary | ICD-10-CM | POA: Diagnosis not present

## 2021-05-12 DIAGNOSIS — E210 Primary hyperparathyroidism: Secondary | ICD-10-CM | POA: Diagnosis not present

## 2021-05-12 DIAGNOSIS — E042 Nontoxic multinodular goiter: Secondary | ICD-10-CM | POA: Diagnosis not present

## 2021-05-14 DIAGNOSIS — Z79899 Other long term (current) drug therapy: Secondary | ICD-10-CM | POA: Diagnosis not present

## 2021-05-14 DIAGNOSIS — G894 Chronic pain syndrome: Secondary | ICD-10-CM | POA: Diagnosis not present

## 2021-05-14 DIAGNOSIS — M545 Low back pain, unspecified: Secondary | ICD-10-CM | POA: Diagnosis not present

## 2021-05-14 DIAGNOSIS — M25519 Pain in unspecified shoulder: Secondary | ICD-10-CM | POA: Diagnosis not present

## 2021-06-10 ENCOUNTER — Ambulatory Visit: Payer: Medicare HMO | Admitting: Dermatology

## 2021-06-10 DIAGNOSIS — E041 Nontoxic single thyroid nodule: Secondary | ICD-10-CM | POA: Diagnosis not present

## 2021-06-10 DIAGNOSIS — K219 Gastro-esophageal reflux disease without esophagitis: Secondary | ICD-10-CM | POA: Diagnosis not present

## 2021-06-10 DIAGNOSIS — J449 Chronic obstructive pulmonary disease, unspecified: Secondary | ICD-10-CM | POA: Diagnosis not present

## 2021-06-10 DIAGNOSIS — E213 Hyperparathyroidism, unspecified: Secondary | ICD-10-CM | POA: Diagnosis not present

## 2021-06-10 DIAGNOSIS — E119 Type 2 diabetes mellitus without complications: Secondary | ICD-10-CM | POA: Diagnosis not present

## 2021-06-10 DIAGNOSIS — Z96612 Presence of left artificial shoulder joint: Secondary | ICD-10-CM | POA: Diagnosis not present

## 2021-06-10 DIAGNOSIS — F319 Bipolar disorder, unspecified: Secondary | ICD-10-CM | POA: Diagnosis not present

## 2021-06-10 DIAGNOSIS — Z79899 Other long term (current) drug therapy: Secondary | ICD-10-CM | POA: Diagnosis not present

## 2021-06-25 ENCOUNTER — Other Ambulatory Visit: Payer: Self-pay

## 2021-06-25 ENCOUNTER — Ambulatory Visit: Payer: Medicare HMO | Admitting: Dermatology

## 2021-06-25 DIAGNOSIS — L719 Rosacea, unspecified: Secondary | ICD-10-CM | POA: Diagnosis not present

## 2021-06-25 DIAGNOSIS — D485 Neoplasm of uncertain behavior of skin: Secondary | ICD-10-CM | POA: Diagnosis not present

## 2021-06-25 DIAGNOSIS — L578 Other skin changes due to chronic exposure to nonionizing radiation: Secondary | ICD-10-CM

## 2021-06-25 DIAGNOSIS — R739 Hyperglycemia, unspecified: Secondary | ICD-10-CM | POA: Diagnosis not present

## 2021-06-25 DIAGNOSIS — D692 Other nonthrombocytopenic purpura: Secondary | ICD-10-CM

## 2021-06-25 DIAGNOSIS — E782 Mixed hyperlipidemia: Secondary | ICD-10-CM | POA: Diagnosis not present

## 2021-06-25 DIAGNOSIS — D492 Neoplasm of unspecified behavior of bone, soft tissue, and skin: Secondary | ICD-10-CM

## 2021-06-25 DIAGNOSIS — H61001 Unspecified perichondritis of right external ear: Secondary | ICD-10-CM | POA: Diagnosis not present

## 2021-06-25 MED ORDER — DOXYCYCLINE MONOHYDRATE 50 MG PO TABS: 50.0000 mg | ORAL_TABLET | Freq: Every day | ORAL | 3 refills | Status: DC

## 2021-06-25 NOTE — Patient Instructions (Addendum)

## 2021-06-25 NOTE — Progress Notes (Signed)
Follow-Up Visit   Subjective  Monica Hill is a 66 y.o. female who presents for the following: CNCH (R ear mid helix in sulcus between helix and antihelix, 94m f/u, tender,  LN2 last visit mometasone cr prn). The patient has spots, moles and lesions to be evaluated, some may be new or changing and the patient has concerns that these could be cancer.  The following portions of the chart were reviewed this encounter and updated as appropriate:   Tobacco   Allergies   Meds   Problems   Med Hx   Surg Hx   Fam Hx      Review of Systems:  No other skin or systemic complaints except as noted in HPI or Assessment and Plan.  Objective  Well appearing patient in no apparent distress; mood and affect are within normal limits.  A focused examination was performed including right ear. Relevant physical exam findings are noted in the Assessment and Plan.  R ear mid helix Crusted thickened pap 1.0 x 0.7cm     face Few paps face with telangiectasias   Assessment & Plan   Purpura - Chronic; persistent and recurrent.  Treatable, but not curable. - Violaceous macules and patches - Benign - Related to trauma, age, sun damage and/or use of blood thinners, chronic use of topical and/or oral steroids - Observe - Can use OTC arnica containing moisturizer such as Dermend Bruise Formula if desired - Call for worsening or other concerns  Actinic Damage - chronic, secondary to cumulative UV radiation exposure/sun exposure over time - diffuse scaly erythematous macules with underlying dyspigmentation - Recommend daily broad spectrum sunscreen SPF 30+ to sun-exposed areas, reapply every 2 hours as needed.  - Recommend staying in the shade or wearing long sleeves, sun glasses (UVA+UVB protection) and wide brim hats (4-inch brim around the entire circumference of the hat). - Call for new or changing lesions.    Neoplasm of skin R ear mid helix  Skin / nail biopsy Type of biopsy: tangential    Informed consent: discussed and consent obtained   Timeout: patient name, date of birth, surgical site, and procedure verified   Procedure prep:  Patient was prepped and draped in usual sterile fashion Prep type:  Isopropyl alcohol Anesthesia: the lesion was anesthetized in a standard fashion   Anesthetic:  1% lidocaine w/ epinephrine 1-100,000 buffered w/ 8.4% NaHCO3 Instrument used: flexible razor blade   Outcome: patient tolerated procedure well   Post-procedure details: sterile dressing applied and wound care instructions given   Dressing type: bandage and bacitracin    Specimen 1 - Surgical pathology Differential Diagnosis: D48.5 CNCH vs other  Check Margins: No Crusted thickened pap 1.0 x 0.7cm  Rosacea face  Recent flare Rosacea is a chronic progressive skin condition usually affecting the face of adults, causing redness and/or acne bumps. It is treatable but not curable. It sometimes affects the eyes (ocular rosacea) as well. It may respond to topical and/or systemic medication and can flare with stress, sun exposure, alcohol, exercise and some foods.  Daily application of broad spectrum spf 30+ sunscreen to face is recommended to reduce flares.  Cont Skin Medicinals metronidazole/ivermectin/azelaic acid twice daily as needed to affected areas on the face.  Start Doxycycline 50mg  1 po qd with food and drink  Doxycycline should be taken with food to prevent nausea. Do not lay down for 30 minutes after taking. Be cautious with sun exposure and use good sun protection while on this  medication. Pregnant women should not take this medication.    doxycycline (ADOXA) 50 MG tablet - face Take 1 tablet (50 mg total) by mouth daily. Take with dinner   Return in about 1 year (around 06/25/2022) for rosacea, [place].  I, Othelia Pulling, RMA, am acting as scribe for Sarina Ser, MD . Documentation: I have reviewed the above documentation for accuracy and completeness, and I agree with  the above.  Sarina Ser, MD

## 2021-06-29 ENCOUNTER — Encounter: Payer: Self-pay | Admitting: Dermatology

## 2021-06-30 ENCOUNTER — Telehealth: Payer: Self-pay

## 2021-06-30 NOTE — Telephone Encounter (Signed)
Patient informed of pathology results. Advised patient to not use Mometasone cream on healing biopsy site, but OK to use PRN flares once healed.

## 2021-06-30 NOTE — Telephone Encounter (Signed)
-----   Message from Ralene Bathe, MD sent at 06/27/2021  3:30 PM EST ----- Diagnosis Skin , right ear mid helix CHONDRODERMATITIS NODULARIS HELICIS, OLD LESION  Benign Chondrodermatitis = inflamed cartilage of ear Difficult to treat - may persist even if treated Recheck next visit

## 2021-07-03 DIAGNOSIS — Z79899 Other long term (current) drug therapy: Secondary | ICD-10-CM | POA: Diagnosis not present

## 2021-07-03 DIAGNOSIS — E669 Obesity, unspecified: Secondary | ICD-10-CM | POA: Diagnosis not present

## 2021-07-03 DIAGNOSIS — E213 Hyperparathyroidism, unspecified: Secondary | ICD-10-CM | POA: Diagnosis not present

## 2021-07-03 DIAGNOSIS — C50912 Malignant neoplasm of unspecified site of left female breast: Secondary | ICD-10-CM | POA: Diagnosis not present

## 2021-07-03 DIAGNOSIS — J449 Chronic obstructive pulmonary disease, unspecified: Secondary | ICD-10-CM | POA: Diagnosis not present

## 2021-07-03 DIAGNOSIS — Z0000 Encounter for general adult medical examination without abnormal findings: Secondary | ICD-10-CM | POA: Diagnosis not present

## 2021-07-03 DIAGNOSIS — I700 Atherosclerosis of aorta: Secondary | ICD-10-CM | POA: Diagnosis not present

## 2021-07-03 DIAGNOSIS — F319 Bipolar disorder, unspecified: Secondary | ICD-10-CM | POA: Diagnosis not present

## 2021-07-03 DIAGNOSIS — E119 Type 2 diabetes mellitus without complications: Secondary | ICD-10-CM | POA: Diagnosis not present

## 2021-07-04 ENCOUNTER — Telehealth: Payer: Self-pay | Admitting: Acute Care

## 2021-07-04 NOTE — Telephone Encounter (Signed)
Called patient to schedule LDCT.  Phone rang several times then cut off. Unable to leave VM

## 2021-07-16 DIAGNOSIS — M159 Polyosteoarthritis, unspecified: Secondary | ICD-10-CM | POA: Diagnosis not present

## 2021-07-16 DIAGNOSIS — M542 Cervicalgia: Secondary | ICD-10-CM | POA: Diagnosis not present

## 2021-07-25 DIAGNOSIS — M799 Soft tissue disorder, unspecified: Secondary | ICD-10-CM | POA: Diagnosis not present

## 2021-07-25 DIAGNOSIS — D351 Benign neoplasm of parathyroid gland: Secondary | ICD-10-CM | POA: Diagnosis not present

## 2021-07-25 DIAGNOSIS — E213 Hyperparathyroidism, unspecified: Secondary | ICD-10-CM | POA: Diagnosis not present

## 2021-07-25 DIAGNOSIS — Z96612 Presence of left artificial shoulder joint: Secondary | ICD-10-CM | POA: Diagnosis not present

## 2021-07-31 DIAGNOSIS — M5412 Radiculopathy, cervical region: Secondary | ICD-10-CM | POA: Diagnosis not present

## 2021-07-31 DIAGNOSIS — R519 Headache, unspecified: Secondary | ICD-10-CM | POA: Diagnosis not present

## 2021-07-31 DIAGNOSIS — M542 Cervicalgia: Secondary | ICD-10-CM | POA: Diagnosis not present

## 2021-07-31 DIAGNOSIS — H53149 Visual discomfort, unspecified: Secondary | ICD-10-CM | POA: Diagnosis not present

## 2021-08-08 DIAGNOSIS — K219 Gastro-esophageal reflux disease without esophagitis: Secondary | ICD-10-CM | POA: Diagnosis not present

## 2021-08-08 DIAGNOSIS — E210 Primary hyperparathyroidism: Secondary | ICD-10-CM | POA: Diagnosis not present

## 2021-08-08 DIAGNOSIS — Z87891 Personal history of nicotine dependence: Secondary | ICD-10-CM | POA: Diagnosis not present

## 2021-08-08 DIAGNOSIS — G4730 Sleep apnea, unspecified: Secondary | ICD-10-CM | POA: Diagnosis not present

## 2021-08-08 DIAGNOSIS — E213 Hyperparathyroidism, unspecified: Secondary | ICD-10-CM | POA: Diagnosis not present

## 2021-08-08 DIAGNOSIS — F32A Depression, unspecified: Secondary | ICD-10-CM | POA: Diagnosis not present

## 2021-08-08 DIAGNOSIS — Z6834 Body mass index (BMI) 34.0-34.9, adult: Secondary | ICD-10-CM | POA: Diagnosis not present

## 2021-08-08 DIAGNOSIS — E669 Obesity, unspecified: Secondary | ICD-10-CM | POA: Diagnosis not present

## 2021-08-08 DIAGNOSIS — F419 Anxiety disorder, unspecified: Secondary | ICD-10-CM | POA: Diagnosis not present

## 2021-08-08 DIAGNOSIS — Z79899 Other long term (current) drug therapy: Secondary | ICD-10-CM | POA: Diagnosis not present

## 2021-08-15 DIAGNOSIS — J45909 Unspecified asthma, uncomplicated: Secondary | ICD-10-CM | POA: Diagnosis not present

## 2021-08-15 DIAGNOSIS — E892 Postprocedural hypoparathyroidism: Secondary | ICD-10-CM | POA: Diagnosis not present

## 2021-08-15 DIAGNOSIS — Z03818 Encounter for observation for suspected exposure to other biological agents ruled out: Secondary | ICD-10-CM | POA: Diagnosis not present

## 2021-08-15 DIAGNOSIS — B349 Viral infection, unspecified: Secondary | ICD-10-CM | POA: Diagnosis not present

## 2021-08-20 DIAGNOSIS — M542 Cervicalgia: Secondary | ICD-10-CM | POA: Diagnosis not present

## 2021-08-20 DIAGNOSIS — H53149 Visual discomfort, unspecified: Secondary | ICD-10-CM | POA: Diagnosis not present

## 2021-08-20 DIAGNOSIS — M5412 Radiculopathy, cervical region: Secondary | ICD-10-CM | POA: Diagnosis not present

## 2021-08-20 DIAGNOSIS — G4486 Cervicogenic headache: Secondary | ICD-10-CM | POA: Diagnosis not present

## 2021-08-20 DIAGNOSIS — R519 Headache, unspecified: Secondary | ICD-10-CM | POA: Diagnosis not present

## 2021-08-21 ENCOUNTER — Other Ambulatory Visit: Payer: Self-pay | Admitting: Physician Assistant

## 2021-08-21 DIAGNOSIS — R519 Headache, unspecified: Secondary | ICD-10-CM

## 2021-08-26 DIAGNOSIS — Z09 Encounter for follow-up examination after completed treatment for conditions other than malignant neoplasm: Secondary | ICD-10-CM | POA: Diagnosis not present

## 2021-08-27 ENCOUNTER — Other Ambulatory Visit: Payer: Self-pay | Admitting: Physician Assistant

## 2021-08-27 DIAGNOSIS — M545 Low back pain, unspecified: Secondary | ICD-10-CM | POA: Diagnosis not present

## 2021-08-27 DIAGNOSIS — G894 Chronic pain syndrome: Secondary | ICD-10-CM | POA: Diagnosis not present

## 2021-08-27 DIAGNOSIS — M5412 Radiculopathy, cervical region: Secondary | ICD-10-CM

## 2021-08-27 DIAGNOSIS — G4486 Cervicogenic headache: Secondary | ICD-10-CM

## 2021-08-27 DIAGNOSIS — M542 Cervicalgia: Secondary | ICD-10-CM

## 2021-08-27 DIAGNOSIS — Z5181 Encounter for therapeutic drug level monitoring: Secondary | ICD-10-CM | POA: Diagnosis not present

## 2021-08-27 DIAGNOSIS — Z79899 Other long term (current) drug therapy: Secondary | ICD-10-CM | POA: Diagnosis not present

## 2021-08-27 DIAGNOSIS — H53149 Visual discomfort, unspecified: Secondary | ICD-10-CM

## 2021-08-27 DIAGNOSIS — R519 Headache, unspecified: Secondary | ICD-10-CM

## 2021-09-02 ENCOUNTER — Other Ambulatory Visit: Payer: Medicare HMO

## 2021-09-09 ENCOUNTER — Ambulatory Visit
Admission: RE | Admit: 2021-09-09 | Discharge: 2021-09-09 | Disposition: A | Discharge status: Home / Self Care | Payer: Medicare HMO | Source: Ambulatory Visit | Attending: Physician Assistant | Admitting: Physician Assistant

## 2021-09-09 DIAGNOSIS — M5412 Radiculopathy, cervical region: Secondary | ICD-10-CM

## 2021-09-09 DIAGNOSIS — M542 Cervicalgia: Secondary | ICD-10-CM

## 2021-09-09 DIAGNOSIS — G930 Cerebral cysts: Secondary | ICD-10-CM | POA: Diagnosis not present

## 2021-09-09 DIAGNOSIS — H53149 Visual discomfort, unspecified: Secondary | ICD-10-CM | POA: Diagnosis not present

## 2021-09-09 DIAGNOSIS — R519 Headache, unspecified: Secondary | ICD-10-CM

## 2021-09-09 DIAGNOSIS — G4486 Cervicogenic headache: Secondary | ICD-10-CM

## 2021-09-09 DIAGNOSIS — M4722 Other spondylosis with radiculopathy, cervical region: Secondary | ICD-10-CM | POA: Diagnosis not present

## 2021-09-09 DIAGNOSIS — M50123 Cervical disc disorder at C6-C7 level with radiculopathy: Secondary | ICD-10-CM | POA: Diagnosis not present

## 2021-09-09 MED ORDER — GADOBENATE DIMEGLUMINE 529 MG/ML IV SOLN
15.0000 mL | Freq: Once | INTRAVENOUS | Status: AC | PRN
  Administered 2021-09-09: 15 mL via INTRAVENOUS

## 2021-09-15 ENCOUNTER — Encounter: Payer: Self-pay | Admitting: Urology

## 2021-09-15 ENCOUNTER — Ambulatory Visit: Payer: Medicare HMO | Admitting: Urology

## 2021-09-15 VITALS — BP 146/82 | HR 108 | Ht 61.0 in | Wt 170.0 lb

## 2021-09-15 DIAGNOSIS — N3941 Urge incontinence: Secondary | ICD-10-CM

## 2021-09-15 DIAGNOSIS — R32 Unspecified urinary incontinence: Secondary | ICD-10-CM | POA: Diagnosis not present

## 2021-09-15 MED ORDER — GEMTESA 75 MG PO TABS: 75.0000 mg | ORAL_TABLET | Freq: Every day | ORAL | 0 refills | Status: DC

## 2021-09-15 NOTE — Progress Notes (Signed)
? ?09/15/2021 ?2:39 PM  ? ?Monica Hill ?December 02, 1955 ?[id number] ? ?Referring provider: Rusty Aus, MD ?[place] ?[facility] ?[place],  [place] [address] ? ?Chief Complaint  ?Patient presents with  ? Urinary Incontinence  ? ? ?HPI: ?Monica Hill is a 66 y.o. female referred for evaluation of urinary incontinence. ? ?Long history of mixed urinary incontinence ?I had seen her at [facility].  She had failed Solifenacin, pelvic floor physical therapy and PTNS ?We had discussed Myrbetriq and Botox however she requested a second opinion with urogynecology.  Subsequently started on Myrbetriq 25 mg and titrated to 50 mg.  She also was fitted with a incontinence ring and underwent Botox x1 ?No significant improvement with any of these therapies and presently on no medication ?Primary reason for her visit today is for a trial of Gemtesa ?Stable symptoms with frequency, urgency urge incontinence ? ? ?PMH: ?Past Medical History:  ?Diagnosis Date  ? Anxiety   ? Arthritis   ? OSTEOARTHRITIS  ? Asthma   ? Bipolar disorder ([place])   ? Cancer [facility])   ? BREAST  ? Complication of anesthesia   ? hard to wake up after one surgery  ? Constipation   ? COPD (chronic obstructive pulmonary disease) ([place])   ? Depression   ? Dyspnea   ? with exertion  ? Family history of adverse reaction to anesthesia   ? son-n/v  ? Fibrocystic breast disease   ? GERD (gastroesophageal reflux disease)   ? Goiter   ? History of colon polyps   ? History of hiatal hernia   ? small  ? History of methicillin resistant staphylococcus aureus (MRSA) 2017  ? History of shingles   ? Hypothyroidism   ? Irritable bowel disease   ? Kienbock's disease   ? Snores   ? Thyromegaly   ? Type 2 diabetes mellitus ([place])   ? Urine incontinence   ? ? ?Surgical History: ?Past Surgical History:  ?Procedure Laterality Date  ? BREAST SURGERY Bilateral   ? MODIFIED RADICAL MASTECTOMY  ? COLONOSCOPY WITH PROPOFOL N/A 03/23/2017  ? Procedure:  COLONOSCOPY WITH PROPOFOL;  Surgeon: Toledo, Benay Pike, MD;  Location: [facility] ENDOSCOPY;  Service: Gastroenterology;  Laterality: N/A;  ? ESOPHAGOGASTRODUODENOSCOPY (EGD) WITH PROPOFOL N/A 03/23/2017  ? Procedure: ESOPHAGOGASTRODUODENOSCOPY (EGD) WITH PROPOFOL;  Surgeon: Toledo, Benay Pike, MD;  Location: [facility] ENDOSCOPY;  Service: Gastroenterology;  Laterality: N/A;  ? JOINT REPLACEMENT Left 04/08/2016  ? TOTAL SHOULDER  ? SHOULDER ARTHROSCOPY    ? x4  ? TONSILLECTOMY    ? TUBAL LIGATION    ? VOCAL CORD RESECTION    ? ? ?Home Medications:  ?Allergies as of 09/15/2021   ? ?   Reactions  ? Silicone Other (See Comments)  ? Burns skin-ok to use paper tape ?Pt states this "tears her skin off"  ? Tape   ? Burns skin-ok to use paper tape  ? ?  ? ?  ?Medication List  ?  ? ?  ? Accurate as of September 15, 2021  2:39 PM. If you have any questions, ask your nurse or doctor.  ?  ?  ? ?  ? ?albuterol 108 (90 Base) MCG/ACT inhaler ?Commonly known as: VENTOLIN HFA ?Inhale 2 puffs into the lungs every 6 (six) hours as needed for wheezing or shortness of breath. ?  ?albuterol (2.5 MG/3ML) 0.083% nebulizer solution ?Commonly known as: PROVENTIL ?Take 2.5 mg by nebulization every 6 (six) hours as needed for  wheezing or shortness of breath. ?  ?BC FAST PAIN RELIEF PO ?Take 1 packet by mouth daily as needed (pain). ?  ?diclofenac Sodium 1 % Gel ?Commonly known as: VOLTAREN ?Apply 1 application topically 4 (four) times daily as needed for pain. ?  ?doxycycline 50 MG tablet ?Commonly known as: ADOXA ?Take 1 tablet (50 mg total) by mouth daily. Take with dinner ?  ?famotidine 40 MG tablet ?Commonly known as: PEPCID ?Take 40 mg by mouth at bedtime. ?  ?fluticasone 50 MCG/ACT nasal spray ?Commonly known as: FLONASE ?Place 2 sprays into both nostrils daily as needed for allergies or rhinitis. ?  ?fluticasone furoate-vilanterol 100-25 MCG/INH Aepb ?Commonly known as: BREO ELLIPTA ?Inhale 1 puff into the lungs daily as needed (shortness of breath). ?   ?HYDROcodone-acetaminophen 5-325 MG tablet ?Commonly known as: NORCO/VICODIN ?Take 1 tablet by mouth 2 (two) times daily as needed for moderate pain. ?  ?lithium carbonate 300 MG capsule ?Take 300 mg by mouth 2 (two) times daily with a meal. ?  ?LUBRICATING EYE DROPS OP ?Place 1 drop into both eyes daily as needed (dry eyes). ?  ?mometasone 0.1 % cream ?Commonly known as: ELOCON ?Apply 1 application topically daily. On bil ears ?  ?montelukast 10 MG tablet ?Commonly known as: SINGULAIR ?Take 10 mg by mouth at bedtime. ?  ?Ozempic (0.25 or 0.5 MG/DOSE) 2 MG/1.5ML Sopn ?Generic drug: Semaglutide(0.25 or 0.'5MG'$ /DOS) ?Inject 0.25 mg into the skin every Tuesday. ?  ?pantoprazole 40 MG tablet ?Commonly known as: PROTONIX ?Take 40 mg by mouth every morning. ?  ?PARoxetine 30 MG tablet ?Commonly known as: PAXIL ?Take 30 mg by mouth every morning. ?  ?polyethylene glycol 17 g packet ?Commonly known as: MIRALAX / GLYCOLAX ?Take 17 g by mouth daily as needed for moderate constipation or mild constipation. ?  ?propranolol 10 MG tablet ?Commonly known as: INDERAL ?Take 20 mg by mouth daily as needed (shaking). ?  ?QUEtiapine 100 MG tablet ?Commonly known as: SEROQUEL ?Take 100 mg by mouth at bedtime. Take with 50 mg to equal 150 mg at bedtime ?  ?QUEtiapine 50 MG tablet ?Commonly known as: SEROQUEL ?Take 50 mg by mouth at bedtime. Take with 100 mg to equal 150 mg at bedtime ?  ?QUEtiapine 25 MG tablet ?Commonly known as: SEROQUEL ?Take 25 mg by mouth See admin instructions. Take 25 mg in the middle of the night as needed for sleep ?  ?Vitamin D (Cholecalciferol) 10 MCG (400 UNIT) Caps ?Take 1,200 Units by mouth daily. ?  ? ?  ? ? ?Allergies:  ?Allergies  ?Allergen Reactions  ? Silicone Other (See Comments)  ?  Burns skin-ok to use paper tape ?Pt states this "tears her skin off"  ? Tape   ?  Burns skin-ok to use paper tape  ? ? ?Family History: ?No family history on file. ? ?Social History:  reports that she quit smoking about 6  years ago. Her smoking use included cigarettes. She has a 63.00 pack-year smoking history. She has never used smokeless tobacco. She reports that she does not drink alcohol and does not use drugs. ? ? ?Physical Exam: ?BP (!) 146/82   Pulse (!) 108   Ht '5\' 1"'$  (1.549 m)   Wt 170 lb (77.1 kg)   LMP 03/23/2008 (Approximate)   BMI 32.12 kg/m?   ?Constitutional:  Alert and oriented, No acute distress. ?HEENT: [place] AT, moist mucus membranes.  Trachea midline, no masses. ?Cardiovascular: No clubbing, cyanosis, or edema. ?Respiratory: Normal respiratory  effort, no increased work of breathing. ?Psychiatric: Normal mood and affect. ? ? ?Assessment & Plan:   ? ?1.  Urge incontinence ?She desired a trial of Gemtesa and was given samples ?Will call back 1 month regarding efficacy ?She was also given a discounted co-pay card ? ?Abbie Sons, MD ? ?[place] ?[address], [address] ?[place], [place] [address] ?([phone] ? ?

## 2021-09-16 LAB — URINALYSIS, COMPLETE
Bilirubin, UA: NEGATIVE
Glucose, UA: NEGATIVE
Ketones, UA: NEGATIVE
Leukocytes,UA: NEGATIVE
Nitrite, UA: NEGATIVE
Protein,UA: NEGATIVE
RBC, UA: NEGATIVE
Specific Gravity, UA: 1.01 (ref 1.005–1.030)
Urobilinogen, Ur: 0.2 mg/dL (ref 0.2–1.0)
pH, UA: 6.5 (ref 5.0–7.5)

## 2021-09-16 LAB — MICROSCOPIC EXAMINATION
Bacteria, UA: NONE SEEN
RBC, Urine: NONE SEEN /hpf (ref 0–2)

## 2021-09-25 ENCOUNTER — Ambulatory Visit: Payer: Medicare HMO | Admitting: Physician Assistant

## 2021-09-25 ENCOUNTER — Encounter: Payer: Self-pay | Admitting: Physician Assistant

## 2021-09-25 VITALS — BP 161/93 | HR 102 | Ht 61.0 in | Wt 170.0 lb

## 2021-09-25 DIAGNOSIS — N3946 Mixed incontinence: Secondary | ICD-10-CM | POA: Diagnosis not present

## 2021-09-25 DIAGNOSIS — R300 Dysuria: Secondary | ICD-10-CM | POA: Diagnosis not present

## 2021-09-25 DIAGNOSIS — N3941 Urge incontinence: Secondary | ICD-10-CM | POA: Diagnosis not present

## 2021-09-25 LAB — BLADDER SCAN AMB NON-IMAGING: Scan Result: 130

## 2021-09-25 MED ORDER — SULFAMETHOXAZOLE-TRIMETHOPRIM 800-160 MG PO TABS: 1.0000 | ORAL_TABLET | Freq: Two times a day (BID) | ORAL | 0 refills | Status: DC

## 2021-09-25 NOTE — Progress Notes (Signed)
? ?09/25/2021 ?4:19 PM  ? ?Monica Hill ?26-Apr-1956 ?[id number] ? ?CC: ?Chief Complaint  ?Patient presents with  ? Urinary Tract Infection  ? ?HPI: ?Monica Hill is a 66 y.o. female with PMH mixed incontinence who previously failed Solifenacin, pelvic floor PT, PTNS, Myrbetriq, and intravesical Botox who presents today for evaluation of possible UTI.  ? ?She saw Dr. Bernardo Heater 10 days ago to reestablish care and requesting a trial of Gemtesa. ? ?Today she reports she took approximately 5 days of Gemtesa and did not notice a difference, but she developed dysuria, urgency, frequency, and lower back pain approximately 3 days then so she stopped the medication.  She has been taking Azo and denies fever, chills, nausea, or vomiting. ? ?She reports mixed urge and stress incontinence as well as insensate bladder leakage.  She has 2 pessaries at home and notes that none of the interventions that she has previously tried have worked well for her.  She is concerned that she smells of urine with her large volume urinary leakage. ? ?In-office UA today positive for trace glucose, trace intact blood, trace protein, nitrites, and 3+ leukocyte esterase; urine microscopy with >30 WBCs/HPF and many bacteria. PVR 157m. ? ?PMH: ?Past Medical History:  ?Diagnosis Date  ? Anxiety   ? Arthritis   ? OSTEOARTHRITIS  ? Asthma   ? Bipolar disorder ([place]   ? [facility]   ? BREAST  ? Complication of anesthesia   ? hard to wake up after one surgery  ? Constipation   ? [place] disease[place] ([place]   ? Depression   ? Dyspnea   ? with exertion  ? Family history of adverse reaction to anesthesia   ? son-n/v  ? Fibrocystic breast disease   ? GERD (gastroesophageal reflux disease)   ? Goiter   ? History of colon polyps   ? History of hiatal hernia   ? small  ? History of methicillin resistant staphylococcus aureus (MRSA) 2017  ? History of shingles   ? Hypothyroidism   ? Irritable bowel disease   ? Kienbock's disease   ?  Snores   ? Thyromegaly   ? Type 2 diabetes mellitus ([place]   ? Urine incontinence   ? ? ?Surgical History: ?Past Surgical History:  ?Procedure Laterality Date  ? BREAST SURGERY Bilateral   ? MODIFIED RADICAL MASTECTOMY  ? COLONOSCOPY WITH PROPOFOL N/A 03/23/2017  ? Procedure: COLONOSCOPY WITH PROPOFOL;  Surgeon: Toledo, TBenay Pike MD;  Location: [facility] ENDOSCOPY;  Service: Gastroenterology;  Laterality: N/A;  ? ESOPHAGOGASTRODUODENOSCOPY (EGD) WITH PROPOFOL N/A 03/23/2017  ? Procedure: ESOPHAGOGASTRODUODENOSCOPY (EGD) WITH PROPOFOL;  Surgeon: Toledo, TBenay Pike MD;  Location: [facility] ENDOSCOPY;  Service: Gastroenterology;  Laterality: N/A;  ? JOINT REPLACEMENT Left 04/08/2016  ? TOTAL SHOULDER  ? SHOULDER ARTHROSCOPY    ? x4  ? TONSILLECTOMY    ? TUBAL LIGATION    ? VOCAL CORD RESECTION    ? ? ?Home Medications:  ?Allergies as of 09/25/2021   ? ?   Reactions  ? Silicone Other (See Comments)  ? Burns skin-ok to use paper tape ?Pt states this "tears her skin off"  ? Tape   ? Burns skin-ok to use paper tape  ? ?  ? ?  ?Medication List  ?  ? ?  ? Accurate as of September 25, 2021  4:19 PM. If you have any questions, ask your nurse or doctor.  ?  ?  ? ?  ? ?STOP taking  these medications   ? ?Gemtesa 75 MG Tabs ?Generic drug: Vibegron ?Stopped by: Debroah Loop, PA-C ?  ? ?  ? ?TAKE these medications   ? ?albuterol 108 (90 Base) MCG/ACT inhaler ?Commonly known as: VENTOLIN HFA ?Inhale 2 puffs into the lungs every 6 (six) hours as needed for wheezing or shortness of breath. ?  ?albuterol (2.5 MG/3ML) 0.083% nebulizer solution ?Commonly known as: PROVENTIL ?Take 2.5 mg by nebulization every 6 (six) hours as needed for wheezing or shortness of breath. ?  ?BC FAST PAIN RELIEF PO ?Take 1 packet by mouth daily as needed (pain). ?  ?diclofenac Sodium 1 % Gel ?Commonly known as: VOLTAREN ?Apply 1 application topically 4 (four) times daily as needed for pain. ?  ?doxycycline 50 MG tablet ?Commonly known as: ADOXA ?Take 1 tablet (50  mg total) by mouth daily. Take with dinner ?  ?famotidine 40 MG tablet ?Commonly known as: PEPCID ?Take 40 mg by mouth at bedtime. ?  ?fluticasone 50 MCG/ACT nasal spray ?Commonly known as: FLONASE ?Place 2 sprays into both nostrils daily as needed for allergies or rhinitis. ?  ?fluticasone furoate-vilanterol 100-25 MCG/INH Aepb ?Commonly known as: BREO ELLIPTA ?Inhale 1 puff into the lungs daily as needed (shortness of breath). ?  ?HYDROcodone-acetaminophen 5-325 MG tablet ?Commonly known as: NORCO/VICODIN ?Take 1 tablet by mouth 2 (two) times daily as needed for moderate pain. ?  ?lithium carbonate 300 MG capsule ?Take 300 mg by mouth 2 (two) times daily with a meal. ?  ?LUBRICATING EYE DROPS OP ?Place 1 drop into both eyes daily as needed (dry eyes). ?  ?mometasone 0.1 % cream ?Commonly known as: ELOCON ?Apply 1 application topically daily. On bil ears ?  ?montelukast 10 MG tablet ?Commonly known as: SINGULAIR ?Take 10 mg by mouth at bedtime. ?  ?Ozempic (0.25 or 0.5 MG/DOSE) 2 MG/1.5ML Sopn ?Generic drug: Semaglutide(0.25 or 0.'5MG'$ /DOS) ?Inject 0.25 mg into the skin every Tuesday. ?  ?pantoprazole 40 MG tablet ?Commonly known as: PROTONIX ?Take 40 mg by mouth every morning. ?  ?PARoxetine 30 MG tablet ?Commonly known as: PAXIL ?Take 30 mg by mouth every morning. ?  ?phenazopyridine 95 MG tablet ?Commonly known as: PYRIDIUM ?Take 95 mg by mouth 3 (three) times daily as needed for pain. ?  ?phentermine 15 MG capsule ?Take 15 mg by mouth every morning. ?  ?polyethylene glycol 17 g packet ?Commonly known as: MIRALAX / GLYCOLAX ?Take 17 g by mouth daily as needed for moderate constipation or mild constipation. ?  ?propranolol 10 MG tablet ?Commonly known as: INDERAL ?Take 20 mg by mouth daily as needed (shaking). ?  ?QUEtiapine 100 MG tablet ?Commonly known as: SEROQUEL ?Take 100 mg by mouth at bedtime. Take with 50 mg to equal 150 mg at bedtime ?  ?QUEtiapine 50 MG tablet ?Commonly known as: SEROQUEL ?Take 50 mg by  mouth at bedtime. Take with 100 mg to equal 150 mg at bedtime ?  ?QUEtiapine 25 MG tablet ?Commonly known as: SEROQUEL ?Take 25 mg by mouth See admin instructions. Take 25 mg in the middle of the night as needed for sleep ?  ?sulfamethoxazole-trimethoprim 800-160 MG tablet ?Commonly known as: BACTRIM DS ?Take 1 tablet by mouth 2 (two) times daily for 5 days. ?Started by: Debroah Loop, PA-C ?  ?Vitamin D (Cholecalciferol) 10 MCG (400 UNIT) Caps ?Take 1,200 Units by mouth daily. ?  ? ?  ? ? ?Allergies:  ?Allergies  ?Allergen Reactions  ? Silicone Other (See Comments)  ?  Burns skin-ok to use paper tape ?Pt states this "tears her skin off"  ? Tape   ?  Burns skin-ok to use paper tape  ? ? ?Family History: ?No family history on file. ? ?Social History:  ? reports that she quit smoking about 6 years ago. Her smoking use included cigarettes. She has a 63.00 pack-year smoking history. She has never used smokeless tobacco. She reports that she does not drink alcohol and does not use drugs. ? ?Physical Exam: ?BP (!) 161/93   Pulse (!) 102   Ht '5\' 1"'$  (1.549 m)   Wt 170 lb (77.1 kg)   LMP 03/23/2008 (Approximate)   BMI 32.12 kg/m?   ?Constitutional:  Alert and oriented, no acute distress, nontoxic appearing ?HEENT: [place], AT ?Cardiovascular: No clubbing, cyanosis, or edema ?Respiratory: Normal respiratory effort, no increased work of breathing ?Skin: No rashes, bruises or suspicious lesions ?Neurologic: Grossly intact, no focal deficits, moving all 4 extremities ?Psychiatric: Normal mood and affect ? ?Laboratory Data: ?Results for orders placed or performed in visit on 09/25/21  ?Microscopic Examination  ? Urine  ?Result Value Ref Range  ? WBC, UA >30 (A) 0 - 5 /hpf  ? RBC 0-2 0 - 2 /hpf  ? Epithelial Cells (non renal) 0-10 0 - 10 /hpf  ? Bacteria, UA Many (A) None seen/Few  ?Urinalysis, Complete  ?Result Value Ref Range  ? Specific Gravity, UA 1.010 1.005 - 1.030  ? pH, UA 6.5 5.0 - 7.5  ? Color, UA Orange Yellow   ? Appearance Ur Hazy (A) Clear  ? Leukocytes,UA 3+ (A) Negative  ? Protein,UA Trace (A) Negative/Trace  ? Glucose, UA Trace (A) Negative  ? Ketones, UA Negative Negative  ? RBC, UA Trace (A) Negative  ? Bilirub

## 2021-09-26 ENCOUNTER — Telehealth: Payer: Self-pay

## 2021-09-26 DIAGNOSIS — R3989 Other symptoms and signs involving the genitourinary system: Secondary | ICD-10-CM

## 2021-09-26 LAB — URINALYSIS, COMPLETE
Bilirubin, UA: NEGATIVE
Ketones, UA: NEGATIVE
Nitrite, UA: POSITIVE — AB
Specific Gravity, UA: 1.01 (ref 1.005–1.030)
Urobilinogen, Ur: 1 mg/dL (ref 0.2–1.0)
pH, UA: 6.5 (ref 5.0–7.5)

## 2021-09-26 LAB — MICROSCOPIC EXAMINATION: WBC, UA: >30 /hpf — AB (ref 0–5)

## 2021-09-26 MED ORDER — CIPROFLOXACIN HCL 250 MG PO TABS: 250.0000 mg | ORAL_TABLET | Freq: Two times a day (BID) | ORAL | 0 refills | Status: DC

## 2021-09-26 NOTE — Telephone Encounter (Signed)
Incoming call from pt who states she was started on Bactrim yesterday and despite taking the medication with food she has had significant abdominal pain and does not feel she can continue the medication. Per verbal orders from [facility], Bactrim discontinued and placed on allergy list as intolerance, RX for sent in for Cipro '250mg'$  BID x 5 days. Pt voiced understanding.  ?

## 2021-09-29 LAB — CULTURE, URINE COMPREHENSIVE

## 2021-09-30 ENCOUNTER — Other Ambulatory Visit: Payer: Self-pay | Admitting: *Deleted

## 2021-09-30 MED ORDER — NITROFURANTOIN MONOHYD MACRO 100 MG PO CAPS: 100.0000 mg | ORAL_CAPSULE | Freq: Two times a day (BID) | ORAL | 0 refills | Status: AC

## 2021-10-01 DIAGNOSIS — M542 Cervicalgia: Secondary | ICD-10-CM | POA: Diagnosis not present

## 2021-10-01 DIAGNOSIS — H538 Other visual disturbances: Secondary | ICD-10-CM | POA: Diagnosis not present

## 2021-10-01 DIAGNOSIS — R519 Headache, unspecified: Secondary | ICD-10-CM | POA: Diagnosis not present

## 2021-10-01 DIAGNOSIS — H53149 Visual discomfort, unspecified: Secondary | ICD-10-CM | POA: Diagnosis not present

## 2021-10-01 DIAGNOSIS — R6884 Jaw pain: Secondary | ICD-10-CM | POA: Diagnosis not present

## 2021-10-10 ENCOUNTER — Other Ambulatory Visit: Payer: Self-pay | Admitting: Dermatology

## 2021-10-10 DIAGNOSIS — L719 Rosacea, unspecified: Secondary | ICD-10-CM

## 2021-11-10 ENCOUNTER — Encounter: Payer: Self-pay | Admitting: Urology

## 2021-11-10 ENCOUNTER — Ambulatory Visit: Payer: Medicare HMO | Admitting: Urology

## 2021-11-10 VITALS — Ht 61.0 in | Wt 165.0 lb

## 2021-11-10 DIAGNOSIS — N3946 Mixed incontinence: Secondary | ICD-10-CM

## 2021-11-10 DIAGNOSIS — R300 Dysuria: Secondary | ICD-10-CM | POA: Diagnosis not present

## 2021-11-10 NOTE — Patient Instructions (Signed)

## 2021-11-10 NOTE — Progress Notes (Signed)
11/10/2021 1:46 PM   Monica Hill 01-Jan-1956 [id number]  Referring provider: Rusty Aus, MD [facility] [place],  [place] [address]  Chief Complaint  Patient presents with   Urinary Incontinence   Follow-up    HPI: Stoioff: Mixed incontinence failed Vesicare pelvic floor therapy and percutaneous tibial nerve stimulation.  She has had 1 Botox at another center.  She has been on Myrbetriq.  She was given [place]  Had a recent urinary tract infection treated  Today Patient currently has urge incontinence.  She leaks with coughing sneezing but urgency is primary symptom.  She has high-volume bedwetting.  She wears 5-6 pads a day moderately wet.  She voids every 1-3 hours.  Flow was reasonable.  She gets up once or less at night.  Gemtesa did not help.  Botox does not help.  She thinks he gets 4 bladder infections a year with pressure that responded favorably antibiotics  No hysterectomy.  Prone to constipation  No neurologic issues.  No history of bladder surgery kidney stones  On pelvic examination patient had grade 2 hypermobility the bladder neck and leaked a small amount after a long cough.  She had asymptomatic grade 1 cystocele.  Moderate atrophy.     PMH: Past Medical History:  Diagnosis Date   Anxiety    Arthritis    OSTEOARTHRITIS   Asthma    Bipolar disorder ([place])    Cancer ([place])    BREAST   Complication of anesthesia    hard to wake up after one surgery   Constipation    COPD (chronic obstructive pulmonary disease) (HCC)    Depression    Dyspnea    with exertion   Family history of adverse reaction to anesthesia    son-n/v   Fibrocystic breast disease    GERD (gastroesophageal reflux disease)    Goiter    History of colon polyps    History of hiatal hernia    small   History of methicillin resistant staphylococcus aureus (MRSA) 2017   History of shingles    Hypothyroidism    Irritable bowel  disease    Kienbock's disease    Snores    Thyromegaly    Type 2 diabetes mellitus ([place])    Urine incontinence     Surgical History: Past Surgical History:  Procedure Laterality Date   BREAST SURGERY Bilateral    MODIFIED RADICAL MASTECTOMY   COLONOSCOPY WITH PROPOFOL N/A 03/23/2017   Procedure: COLONOSCOPY WITH PROPOFOL;  Surgeon: Toledo, Benay Pike, MD;  Location: [facility] ENDOSCOPY;  Service: Gastroenterology;  Laterality: N/A;   ESOPHAGOGASTRODUODENOSCOPY (EGD) WITH PROPOFOL N/A 03/23/2017   Procedure: ESOPHAGOGASTRODUODENOSCOPY (EGD) WITH PROPOFOL;  Surgeon: Toledo, Benay Pike, MD;  Location: [facility] ENDOSCOPY;  Service: Gastroenterology;  Laterality: N/A;   JOINT REPLACEMENT Left 04/08/2016   TOTAL SHOULDER   SHOULDER ARTHROSCOPY     x4   TONSILLECTOMY     TUBAL LIGATION     VOCAL CORD RESECTION      Home Medications:  Allergies as of 11/10/2021       Reactions   Silicone Other (See Comments)   Burns skin-ok to use paper tape Pt states this "tears her skin off"   Tape    Burns skin-ok to use paper tape   Bactrim [sulfamethoxazole-trimethoprim] Other (See Comments)   Abdominal Pain        Medication List        Accurate as of November 10, 2021  1:46 PM. If you have any questions, ask your nurse or doctor.          STOP taking these medications    famotidine 40 MG tablet Commonly known as: PEPCID Stopped by: Reece Packer, MD   pantoprazole 40 MG tablet Commonly known as: PROTONIX Stopped by: Reece Packer, MD       TAKE these medications    albuterol 108 (90 Base) MCG/ACT inhaler Commonly known as: VENTOLIN HFA Inhale 2 puffs into the lungs every 6 (six) hours as needed for wheezing or shortness of breath.   albuterol (2.5 MG/3ML) 0.083% nebulizer solution Commonly known as: PROVENTIL Take 2.5 mg by nebulization every 6 (six) hours as needed for wheezing or shortness of breath.   BC FAST PAIN RELIEF PO Take 1 packet by mouth daily as needed  (pain).   diclofenac Sodium 1 % Gel Commonly known as: VOLTAREN Apply 1 application topically 4 (four) times daily as needed for pain.   doxycycline 50 MG tablet Commonly known as: ADOXA TAKE 1 TABLET (50 MG TOTAL) BY MOUTH DAILY. TAKE WITH DINNER   fluticasone 50 MCG/ACT nasal spray Commonly known as: FLONASE Place 2 sprays into both nostrils daily as needed for allergies or rhinitis.   fluticasone furoate-vilanterol 100-25 MCG/INH Aepb Commonly known as: BREO ELLIPTA Inhale 1 puff into the lungs daily as needed (shortness of breath).   HYDROcodone-acetaminophen 5-325 MG tablet Commonly known as: NORCO/VICODIN Take 1 tablet by mouth 2 (two) times daily as needed for moderate pain.   lithium carbonate 300 MG capsule Take 300 mg by mouth 2 (two) times daily with a meal.   LUBRICATING EYE DROPS OP Place 1 drop into both eyes daily as needed (dry eyes).   mometasone 0.1 % cream Commonly known as: ELOCON Apply 1 application topically daily. On bil ears   montelukast 10 MG tablet Commonly known as: SINGULAIR Take 10 mg by mouth at bedtime.   omeprazole 40 MG capsule Commonly known as: PRILOSEC Take 40 mg by mouth daily.   Ozempic (1 MG/DOSE) 4 MG/3ML Sopn Generic drug: Semaglutide (1 MG/DOSE) SMARTSIG:0.75 Milliliter(s) SUB-Q Once a Week What changed: Another medication with the same name was removed. Continue taking this medication, and follow the directions you see here. Changed by: Reece Packer, MD   PARoxetine 30 MG tablet Commonly known as: PAXIL Take 30 mg by mouth every morning.   phenazopyridine 95 MG tablet Commonly known as: PYRIDIUM Take 95 mg by mouth 3 (three) times daily as needed for pain.   phentermine 15 MG capsule Take 15 mg by mouth every morning.   polyethylene glycol 17 g packet Commonly known as: MIRALAX / GLYCOLAX Take 17 g by mouth daily as needed for moderate constipation or mild constipation.   pregabalin 50 MG capsule Commonly  known as: LYRICA Take 50 mg by mouth 2 (two) times daily.   propranolol 10 MG tablet Commonly known as: INDERAL Take 20 mg by mouth daily as needed (shaking).   QUEtiapine 100 MG tablet Commonly known as: SEROQUEL Take 100 mg by mouth at bedtime. Take with 50 mg to equal 150 mg at bedtime   QUEtiapine 50 MG tablet Commonly known as: SEROQUEL Take 50 mg by mouth at bedtime. Take with 100 mg to equal 150 mg at bedtime   QUEtiapine 25 MG tablet Commonly known as: SEROQUEL Take 25 mg by mouth See admin instructions. Take 25 mg in the middle of the night as needed for sleep  rosuvastatin 5 MG tablet Commonly known as: CRESTOR Take 5 mg by mouth daily.   Vitamin D (Cholecalciferol) 10 MCG (400 UNIT) Caps Take 1,200 Units by mouth daily.        Allergies:  Allergies  Allergen Reactions   Silicone Other (See Comments)    Burns skin-ok to use paper tape Pt states this "tears her skin off"   Tape     Burns skin-ok to use paper tape   Bactrim [Sulfamethoxazole-Trimethoprim] Other (See Comments)    Abdominal Pain    Family History: No family history on file.  Social History:  reports that she quit smoking about 6 years ago. Her smoking use included cigarettes. She has a 63.00 pack-year smoking history. She has been exposed to tobacco smoke. She has never used smokeless tobacco. She reports that she does not drink alcohol and does not use drugs.  ROS:                                        Physical Exam: Ht '5\' 1"'$  (1.549 m)   Wt 165 lb (74.8 kg)   LMP 03/23/2008 (Approximate)   BMI 31.18 kg/m   Constitutional:  Alert and oriented, No acute distress. HEENT: [place] AT, moist mucus membranes.  Trachea midline, no masses.   Laboratory Data: No results found for: "WBC", "HGB", "HCT", "MCV", "PLT"  Lab Results  Component Value Date   CREATININE 1.05 (H) 04/11/2021    No results found for: "PSA"  No results found for: "TESTOSTERONE"  No results  found for: "HGBA1C"  Urinalysis    Component Value Date/Time   APPEARANCEUR Hazy (A) 09/25/2021 1516   GLUCOSEU Trace (A) 09/25/2021 1516   BILIRUBINUR Negative 09/25/2021 1516   PROTEINUR Trace (A) 09/25/2021 1516   NITRITE Positive (A) 09/25/2021 1516   LEUKOCYTESUR 3+ (A) 09/25/2021 1516    Pertinent Imaging:   Assessment & Plan: Patient has mixed incontinence but primarily urge incontinence bedwetting refractory to multiple medications including Botox.  She will return for urodynamics and cystoscopy  1. Dysuria  - Urinalysis, Complete   No follow-ups on file.  Reece Packer, MD  [facility] [address], [place], [place] [address] [phone]

## 2021-11-10 NOTE — Addendum Note (Signed)
Addended by: Despina Hidden on: 11/10/2021 03:37 PM   Modules accepted: Orders

## 2021-12-04 DIAGNOSIS — H53149 Visual discomfort, unspecified: Secondary | ICD-10-CM | POA: Diagnosis not present

## 2021-12-04 DIAGNOSIS — H539 Unspecified visual disturbance: Secondary | ICD-10-CM | POA: Diagnosis not present

## 2021-12-04 DIAGNOSIS — M542 Cervicalgia: Secondary | ICD-10-CM | POA: Diagnosis not present

## 2021-12-04 DIAGNOSIS — R519 Headache, unspecified: Secondary | ICD-10-CM | POA: Diagnosis not present

## 2021-12-04 DIAGNOSIS — M5412 Radiculopathy, cervical region: Secondary | ICD-10-CM | POA: Diagnosis not present

## 2021-12-09 DIAGNOSIS — N3946 Mixed incontinence: Secondary | ICD-10-CM | POA: Diagnosis not present

## 2021-12-11 ENCOUNTER — Other Ambulatory Visit: Payer: Self-pay | Admitting: Urology

## 2021-12-15 ENCOUNTER — Telehealth: Payer: Self-pay | Admitting: Family Medicine

## 2021-12-15 DIAGNOSIS — R3989 Other symptoms and signs involving the genitourinary system: Secondary | ICD-10-CM

## 2021-12-15 MED ORDER — CIPROFLOXACIN HCL 250 MG PO TABS
250.0000 mg | ORAL_TABLET | Freq: Two times a day (BID) | ORAL | 0 refills | Status: DC
Start: 2021-12-15 — End: 2021-12-18

## 2021-12-15 NOTE — Telephone Encounter (Signed)
LMOM informed patient Monica Hill sent to pharmacy.

## 2021-12-15 NOTE — Telephone Encounter (Signed)
Patient called and states she has a uti. She was seen at [facility] last week. She is requesting a ABX. Please advise

## 2021-12-18 ENCOUNTER — Telehealth: Payer: Self-pay

## 2021-12-18 NOTE — Telephone Encounter (Signed)
Patient called stating she is having vomiting, abdominal pain and diarrhea from cipro. I advised pt to DC cipro. Pt is still having dysuria, advised pt she needs to come in and be evaluated. Pt is scheduled on Monday. If symptoms worsen or pt develops fever before her appointment she needs to seek medical attention. Pt aware and verbalized understanding.

## 2021-12-18 NOTE — Addendum Note (Signed)
Addended by: Alvera Novel on: 12/18/2021 03:55 PM   Modules accepted: Orders

## 2021-12-18 NOTE — Telephone Encounter (Signed)
error 

## 2021-12-19 MED ORDER — NITROFURANTOIN MONOHYD MACRO 100 MG PO CAPS: 100.0000 mg | ORAL_CAPSULE | Freq: Two times a day (BID) | ORAL | 0 refills | Status: DC

## 2021-12-19 NOTE — Addendum Note (Signed)
Addended by: Donalee Citrin on: 12/19/2021 12:45 PM   Modules accepted: Orders

## 2021-12-19 NOTE — Telephone Encounter (Signed)
Per Dr. Matilde Sprang, RX sent for Macrobid '100mg'$  BID x 7 days. Called pt to inform her, no answer. LM.

## 2021-12-19 NOTE — Telephone Encounter (Signed)
Incoming call from pt who states she would like to know if Dr. Matilde Sprang will start her on a new antibiotic prior to upcoming appt on Monday. Advised pt that I would send message to provider.

## 2021-12-22 ENCOUNTER — Encounter: Payer: Self-pay | Admitting: Urology

## 2021-12-22 ENCOUNTER — Ambulatory Visit: Payer: Medicare HMO | Admitting: Urology

## 2021-12-22 VITALS — BP 144/87 | HR 96 | Ht 61.0 in | Wt 166.0 lb

## 2021-12-22 DIAGNOSIS — R300 Dysuria: Secondary | ICD-10-CM

## 2021-12-22 DIAGNOSIS — N3946 Mixed incontinence: Secondary | ICD-10-CM

## 2021-12-22 DIAGNOSIS — R319 Hematuria, unspecified: Secondary | ICD-10-CM | POA: Diagnosis not present

## 2021-12-22 DIAGNOSIS — R3989 Other symptoms and signs involving the genitourinary system: Secondary | ICD-10-CM | POA: Diagnosis not present

## 2021-12-22 LAB — MICROSCOPIC EXAMINATION: WBC, UA: >30 /hpf — AB (ref 0–5)

## 2021-12-22 LAB — URINALYSIS, COMPLETE
Bilirubin, UA: NEGATIVE
Glucose, UA: NEGATIVE
Ketones, UA: NEGATIVE
Nitrite, UA: NEGATIVE
Protein,UA: NEGATIVE
Specific Gravity, UA: 1.02 (ref 1.005–1.030)
Urobilinogen, Ur: 0.2 mg/dL (ref 0.2–1.0)
pH, UA: 6 (ref 5.0–7.5)

## 2021-12-22 MED ORDER — NITROFURANTOIN MACROCRYSTAL 100 MG PO CAPS: 100.0000 mg | ORAL_CAPSULE | Freq: Every day | ORAL | 11 refills | Status: AC

## 2021-12-22 NOTE — Patient Instructions (Signed)

## 2021-12-22 NOTE — Progress Notes (Signed)
12/22/2021 11:35 AM   Mon[place] Hill [place] 1955/12/09 [id number]  Referring provider: Rusty Aus, MD [facility] [place],  [place] [address]  Chief Complaint  Patient presents with   Dysuria    Follow up    HPI: Stoioff: Mixed incontinence failed Vesicare pelvic floor therapy and percutaneous tibial nerve stimulation.  She has had 1 Botox at another center.  She has been on Myrbetriq.  She was given [place]  Had a recent urinary tract infection treated  Today Patient currently has urge incontinence.  She leaks with coughing sneezing but urgency is primary symptom.  She has high-volume bedwetting.  She wears 5-6 pads a day moderately wet.  She voids every 1-3 hours.  Flow was reasonable.  She gets up once or less at night.  Gemtesa did not help.  Botox does not help.  She thinks he gets 4 bladder infections a year with pressure that responded favorably antibiotics  No hysterectomy.  Prone to constipation  On pelvic examination patient had grade 2 hypermobility the bladder neck and leaked a small amount after a long cough.  She had asymptomatic grade 1 cystocele.  Moderate atrophy.    Patient has mixed incontinence but primarily urge incontinence bedwetting refractory to multiple medications including Botox.  She will return for urodynamics and cystoscopy   Today On July 17 patient stated she was having a bladder infection and she called in.  It was following urodynamics.  She was called in ciprofloxacin.  She called in with abdominal pain and diarrhea on the ciprofloxacin.  She was still having dysuria.  She subsequently on July 21 was called in Macrodantin but she did not take it.  She was given doxycycline by another physician.  She had a positive urine culture April 2023 and bacteria was resistant to doxycycline.  The urine was mildly positive so I decided not to cystoscope her today.  She did have microscopic hematuria which she did  not have on 2 prior urinalysis.  She had a 4 mm echogenic nonshadowing focus in both kidneys and ultrasound in October 2022.  During urodynamics she voided 54 mL with a maximum flow of approximately 11 mils per second and a residual was 25 mL.  She had a prolonged pattern.  Maximum bladder capacity was 300 mL.  She increased bladder sensation.  Bladder was stable.  Her cough leak point pressure 200 mL was 107 cmH2O with moderate leakage.  At 300 mL her cough leak point pressure was 165 cm of water with high-volume leakage.  During voluntary voiding she voided 171 mL with a maximal flow 60 mils per second.  Max voiding pressure 21 cm of water.  It was an interrupted pattern.  Contractions were not well sustained.  She was straining significantly residual was 171 mL.  EMG activity within normal.  Bladder neck descent at less than a centimeter.  Mild bladder trabeculation she had to bear down hard at the end to empty.  The details of the urodynamics are signed dictated   PMH: Past Medical History:  Diagnosis Date   Anxiety    Arthritis    OSTEOARTHRITIS   Asthma    Bipolar disorder ([place])    Cancer ([place])    BREAST   Complication of anesthesia    hard to wake up after one surgery   Constipation    COPD (chronic obstructive pulmonary disease) (HCC)    Depression    Dyspnea    with  exertion   Family history of adverse reaction to anesthesia    son-n/v   Fibrocystic breast disease    GERD (gastroesophageal reflux disease)    Goiter    History of colon polyps    History of hiatal hernia    small   History of methicillin resistant staphylococcus aureus (MRSA) 2017   History of shingles    Hypothyroidism    Irritable bowel disease    Kienbock's disease    Snores    Thyromegaly    Type 2 diabetes mellitus ([place])    Urine incontinence     Surgical History: Past Surgical History:  Procedure Laterality Date   BREAST SURGERY Bilateral    MODIFIED RADICAL MASTECTOMY   COLONOSCOPY WITH  PROPOFOL N/A 03/23/2017   Procedure: COLONOSCOPY WITH PROPOFOL;  Surgeon: Toledo, Benay Pike, MD;  Location: [facility] ENDOSCOPY;  Service: Gastroenterology;  Laterality: N/A;   ESOPHAGOGASTRODUODENOSCOPY (EGD) WITH PROPOFOL N/A 03/23/2017   Procedure: ESOPHAGOGASTRODUODENOSCOPY (EGD) WITH PROPOFOL;  Surgeon: Toledo, Benay Pike, MD;  Location: [facility] ENDOSCOPY;  Service: Gastroenterology;  Laterality: N/A;   JOINT REPLACEMENT Left 04/08/2016   TOTAL SHOULDER   SHOULDER ARTHROSCOPY     x4   TONSILLECTOMY     TUBAL LIGATION     VOCAL CORD RESECTION      Home Medications:  Allergies as of 12/22/2021       Reactions   Silicone Other (See Comments)   Burns skin-ok to use paper tape Pt states this "tears her skin off"   Tape    Burns skin-ok to use paper tape   Bactrim [sulfamethoxazole-trimethoprim] Other (See Comments)   Abdominal Pain        Medication List        Accurate as of December 22, 2021 11:35 AM. If you have any questions, ask your nurse or doctor.          albuterol 108 (90 Base) MCG/ACT inhaler Commonly known as: VENTOLIN HFA Inhale 2 puffs into the lungs every 6 (six) hours as needed for wheezing or shortness of breath.   albuterol (2.5 MG/3ML) 0.083% nebulizer solution Commonly known as: PROVENTIL Take 2.5 mg by nebulization every 6 (six) hours as needed for wheezing or shortness of breath.   BC FAST PAIN RELIEF PO Take 1 packet by mouth daily as needed (pain).   diclofenac Sodium 1 % Gel Commonly known as: VOLTAREN Apply 1 application topically 4 (four) times daily as needed for pain.   doxycycline 50 MG tablet Commonly known as: ADOXA TAKE 1 TABLET (50 MG TOTAL) BY MOUTH DAILY. TAKE WITH DINNER   fluticasone 50 MCG/ACT nasal spray Commonly known as: FLONASE Place 2 sprays into both nostrils daily as needed for allergies or rhinitis.   fluticasone furoate-vilanterol 100-25 MCG/INH Aepb Commonly known as: BREO ELLIPTA Inhale 1 puff into the lungs daily as  needed (shortness of breath).   HYDROcodone-acetaminophen 5-325 MG tablet Commonly known as: NORCO/VICODIN Take 1 tablet by mouth 2 (two) times daily as needed for moderate pain.   lithium carbonate 300 MG capsule Take 300 mg by mouth 2 (two) times daily with a meal.   LUBRICATING EYE DROPS OP Place 1 drop into both eyes daily as needed (dry eyes).   mometasone 0.1 % cream Commonly known as: ELOCON Apply 1 application topically daily. On bil ears   montelukast 10 MG tablet Commonly known as: SINGULAIR Take 10 mg by mouth at bedtime.   nitrofurantoin (macrocrystal-monohydrate) 100 MG capsule Commonly known as: MACROBID Take  1 capsule (100 mg total) by mouth 2 (two) times daily for 7 days.   omeprazole 40 MG capsule Commonly known as: PRILOSEC Take 40 mg by mouth daily.   Ozempic (1 MG/DOSE) 4 MG/3ML Sopn Generic drug: Semaglutide (1 MG/DOSE) SMARTSIG:0.75 Milliliter(s) SUB-Q Once a Week   PARoxetine 30 MG tablet Commonly known as: PAXIL Take 30 mg by mouth every morning.   phenazopyridine 95 MG tablet Commonly known as: PYRIDIUM Take 95 mg by mouth 3 (three) times daily as needed for pain.   phentermine 15 MG capsule Take 15 mg by mouth every morning.   polyethylene glycol 17 g packet Commonly known as: MIRALAX / GLYCOLAX Take 17 g by mouth daily as needed for moderate constipation or mild constipation.   pregabalin 50 MG capsule Commonly known as: LYRICA Take 50 mg by mouth 2 (two) times daily.   propranolol 10 MG tablet Commonly known as: INDERAL Take 20 mg by mouth daily as needed (shaking).   QUEtiapine 100 MG tablet Commonly known as: SEROQUEL Take 100 mg by mouth at bedtime. Take with 50 mg to equal 150 mg at bedtime   QUEtiapine 50 MG tablet Commonly known as: SEROQUEL Take 50 mg by mouth at bedtime. Take with 100 mg to equal 150 mg at bedtime   QUEtiapine 25 MG tablet Commonly known as: SEROQUEL Take 25 mg by mouth See admin instructions. Take  25 mg in the middle of the night as needed for sleep   rosuvastatin 5 MG tablet Commonly known as: CRESTOR Take 5 mg by mouth daily.   Vitamin D (Cholecalciferol) 10 MCG (400 UNIT) Caps Take 1,200 Units by mouth daily.        Allergies:  Allergies  Allergen Reactions   Silicone Other (See Comments)    Burns skin-ok to use paper tape Pt states this "tears her skin off"   Tape     Burns skin-ok to use paper tape   Bactrim [Sulfamethoxazole-Trimethoprim] Other (See Comments)    Abdominal Pain    Family History: No family history on file.  Social History:  reports that she quit smoking about 6 years ago. Her smoking use included cigarettes. She has a 63.00 pack-year smoking history. She has been exposed to tobacco smoke. She has never used smokeless tobacco. She reports that she does not drink alcohol and does not use drugs.  ROS:                                        Physical Exam: BP (!) 144/87   Pulse 96   Ht '5\' 1"'$  (1.549 m)   Wt 75.3 kg   LMP 03/23/2008 (Approximate)   BMI 31.37 kg/m   Constitutional:  Alert and oriented, No acute distress. HEENT: [place] AT, moist mucus membranes.  Trachea midline, no masses.   Laboratory Data: No results found for: "WBC", "HGB", "HCT", "MCV", "PLT"  Lab Results  Component Value Date   CREATININE 1.05 (H) 04/11/2021    No results found for: "PSA"  No results found for: "TESTOSTERONE"  No results found for: "HGBA1C"  Urinalysis    Component Value Date/Time   APPEARANCEUR Hazy (A) 09/25/2021 1516   GLUCOSEU Trace (A) 09/25/2021 1516   BILIRUBINUR Negative 09/25/2021 1516   PROTEINUR Trace (A) 09/25/2021 1516   NITRITE Positive (A) 09/25/2021 1516   LEUKOCYTESUR 3+ (A) 09/25/2021 1516    Pertinent Imaging:  Assessment & Plan: Patient has mixed incontinence.  Approximately 80% of her incontinence is due to an overactive bladder with urge incontinence and bedwetting.  High-volume episodes due  to overactive bladder and not stress incontinence.  She has mild stress incontinence with high leak point pressures.  She has failed many medications including percutaneous tibial nerve stimulation and Botox.  She is getting recurrent bladder infections.  I will check her urine again in the future to make certain she does not have microscopic hematuria.  I did not want a false positive today on cystoscopy.    Pathophysiology of UTIs discussed.  She understands that this could be up regulating her incontinence.  I decided to get a CT scan to make certain her kidneys are normal.  I will see her in 6 weeks for cystoscopy for microhematuria and urge incontinence refractory for cystoscopy.  I will then discuss InterStim unless she down regulates.  I called and Macrodantin 100 mg 3x11.  She takes low-dose doxycycline daily for skin condition but she still gets 4-5 bladder infections a year.  I did not add a medication to the Macrodantin since none of them worked in the past  1. Dysuria  - Urinalysis, Complete - CULTURE, URINE COMPREHENSIVE   No follow-ups on file.  Reece Packer, MD  [facility] [address] [place], [place] [address] [phone]

## 2021-12-25 LAB — CULTURE, URINE COMPREHENSIVE

## 2021-12-26 ENCOUNTER — Telehealth: Payer: Self-pay | Admitting: *Deleted

## 2021-12-26 MED ORDER — CEFDINIR 300 MG PO CAPS: ORAL_CAPSULE | ORAL | 0 refills | Status: DC

## 2021-12-26 NOTE — Telephone Encounter (Addendum)
Left Vm for patient with details, asked to return call with confirmation. Sent RX to [facility].    ----- Message from Bjorn Loser, MD sent at 12/25/2021  6:10 PM EDT ----- Carole Civil 300 mg bid for 7 days, then go back on daily macrodantin ----- Message ----- From: Shanon Ace, CMA Sent: 12/25/2021   9:05 AM EDT To: Bjorn Loser, MD   ----- Message ----- From: Lavone Neri Lab Results In Sent: 12/22/2021   4:41 PM EDT To: Rowe Robert Clinical

## 2021-12-30 ENCOUNTER — Ambulatory Visit
Admission: RE | Admit: 2021-12-30 | Discharge: 2021-12-30 | Disposition: A | Discharge status: Home / Self Care | Payer: Medicare HMO | Source: Ambulatory Visit | Attending: Urology | Admitting: Urology

## 2021-12-30 DIAGNOSIS — R319 Hematuria, unspecified: Secondary | ICD-10-CM | POA: Diagnosis not present

## 2021-12-30 DIAGNOSIS — R300 Dysuria: Secondary | ICD-10-CM | POA: Diagnosis not present

## 2021-12-30 DIAGNOSIS — N3946 Mixed incontinence: Secondary | ICD-10-CM | POA: Insufficient documentation

## 2021-12-30 DIAGNOSIS — R3989 Other symptoms and signs involving the genitourinary system: Secondary | ICD-10-CM | POA: Insufficient documentation

## 2021-12-30 LAB — POCT I-STAT CREATININE: Creatinine, Ser: 0.8 mg/dL (ref 0.44–1.00)

## 2021-12-30 MED ORDER — IOHEXOL 300 MG/ML  SOLN
125.0000 mL | Freq: Once | INTRAMUSCULAR | Status: AC | PRN
  Administered 2021-12-30: 125 mL via INTRAVENOUS

## 2022-01-01 DIAGNOSIS — E785 Hyperlipidemia, unspecified: Secondary | ICD-10-CM | POA: Diagnosis not present

## 2022-01-01 DIAGNOSIS — E1169 Type 2 diabetes mellitus with other specified complication: Secondary | ICD-10-CM | POA: Diagnosis not present

## 2022-01-08 DIAGNOSIS — Z0000 Encounter for general adult medical examination without abnormal findings: Secondary | ICD-10-CM | POA: Diagnosis not present

## 2022-01-08 DIAGNOSIS — E669 Obesity, unspecified: Secondary | ICD-10-CM | POA: Diagnosis not present

## 2022-01-08 DIAGNOSIS — E119 Type 2 diabetes mellitus without complications: Secondary | ICD-10-CM | POA: Diagnosis not present

## 2022-01-08 DIAGNOSIS — E538 Deficiency of other specified B group vitamins: Secondary | ICD-10-CM | POA: Diagnosis not present

## 2022-01-08 DIAGNOSIS — F319 Bipolar disorder, unspecified: Secondary | ICD-10-CM | POA: Diagnosis not present

## 2022-01-19 ENCOUNTER — Other Ambulatory Visit: Payer: Medicare HMO | Admitting: Urology

## 2022-01-20 DIAGNOSIS — H524 Presbyopia: Secondary | ICD-10-CM | POA: Diagnosis not present

## 2022-01-26 DIAGNOSIS — E042 Nontoxic multinodular goiter: Secondary | ICD-10-CM | POA: Diagnosis not present

## 2022-01-26 DIAGNOSIS — M816 Localized osteoporosis [Lequesne]: Secondary | ICD-10-CM | POA: Diagnosis not present

## 2022-01-26 DIAGNOSIS — Z9889 Other specified postprocedural states: Secondary | ICD-10-CM | POA: Diagnosis not present

## 2022-01-26 DIAGNOSIS — E0590 Thyrotoxicosis, unspecified without thyrotoxic crisis or storm: Secondary | ICD-10-CM | POA: Diagnosis not present

## 2022-01-29 DIAGNOSIS — M1990 Unspecified osteoarthritis, unspecified site: Secondary | ICD-10-CM | POA: Diagnosis not present

## 2022-01-29 DIAGNOSIS — M159 Polyosteoarthritis, unspecified: Secondary | ICD-10-CM | POA: Diagnosis not present

## 2022-01-29 DIAGNOSIS — M19041 Primary osteoarthritis, right hand: Secondary | ICD-10-CM | POA: Diagnosis not present

## 2022-01-29 DIAGNOSIS — M19042 Primary osteoarthritis, left hand: Secondary | ICD-10-CM | POA: Diagnosis not present

## 2022-02-09 ENCOUNTER — Encounter: Payer: Self-pay | Admitting: Urology

## 2022-02-09 ENCOUNTER — Ambulatory Visit: Payer: Medicare HMO | Admitting: Urology

## 2022-02-09 VITALS — BP 177/87 | HR 116 | Ht 61.0 in | Wt 165.0 lb

## 2022-02-09 DIAGNOSIS — N3946 Mixed incontinence: Secondary | ICD-10-CM | POA: Diagnosis not present

## 2022-02-09 LAB — URINALYSIS, COMPLETE
Bilirubin, UA: NEGATIVE
Glucose, UA: NEGATIVE
Ketones, UA: NEGATIVE
Leukocytes,UA: NEGATIVE
Nitrite, UA: NEGATIVE
Protein,UA: NEGATIVE
RBC, UA: NEGATIVE
Specific Gravity, UA: 1.025 (ref 1.005–1.030)
Urobilinogen, Ur: 0.2 mg/dL (ref 0.2–1.0)
pH, UA: 5 (ref 5.0–7.5)

## 2022-02-09 LAB — MICROSCOPIC EXAMINATION

## 2022-02-09 NOTE — Progress Notes (Signed)
02/09/2022 3:35 PM   Monica Hill 30-Sep-1955 [id number]  Referring provider: Rusty Aus, MD [facility] [place],  [place] [address]  Chief Complaint  Patient presents with   Cysto    HPI: [place]: Mixed incontinence failed Vesicare pelvic floor therapy and percutaneous tibial nerve stimulation.  She has had 1 Botox at another center.  She has been on Myrbetriq.  She was given [place]  Had a recent urinary tract infection treated  Today Patient currently has urge incontinence.  She leaks with coughing sneezing but urgency is primary symptom.  She has high-volume bedwetting.  She wears 5-6 pads a day moderately wet.  She voids every 1-3 hours.  Flow was reasonable.  She gets up once or less at night.  Gemtesa did not help.  Botox does not help.  She thinks he gets 4 bladder infections a year with pressure that responded favorably antibiotics  No hysterectomy.  Prone to constipation  On pelvic examination patient had grade 2 hypermobility the bladder neck and leaked a small amount after a long cough.  She had asymptomatic grade 1 cystocele.  Moderate atrophy.     Patient has mixed incontinence but primarily urge incontinence bedwetting refractory to multiple medications including Botox.  And PTNS   Today On July 17 patient stated she was having a bladder infection and she called in.  It was following urodynamics.  She was called in ciprofloxacin.  She called in with abdominal pain and diarrhea on the ciprofloxacin.  She was still having dysuria.  She subsequently on July 21 was called in Macrodantin but she did not take it.  She was given doxycycline by another physician.  She had a positive urine culture April 2023 and bacteria was resistant to doxycycline.   The urine was mildly positive so I decided not to cystoscope her today.  She did have microscopic hematuria which she did not have on 2 prior urinalysis.  She had a 4 mm  echogenic nonshadowing focus in both kidneys and ultrasound in October 2022.   During urodynamics she voided 54 mL with a maximum flow of approximately 11 mils per second and a residual was 25 mL.  She had a prolonged pattern.  Maximum bladder capacity was 300 mL.  She increased bladder sensation.  Bladder was stable.  Her cough leak point pressure 200 mL was 107 cmH2O with moderate leakage.  At 300 mL her cough leak point pressure was 165 cm of water with high-volume leakage.  During voluntary voiding she voided 171 mL with a maximal flow 60 mils per second.  Max voiding pressure 21 cm of water.  It was an interrupted pattern.  Contractions were not well sustained.  She was straining significantly residual was 171 mL.  EMG activity within normal.  Bladder neck descent at less than a centimeter.  Mild bladder trabeculation she had to bear down hard at the end to empty.    Patient has mixed incontinence.  Approximately 80% of her incontinence is due to an overactive bladder with urge incontinence and bedwetting.  High-volume episodes due to overactive bladder and not stress incontinence.  She has mild stress incontinence with high leak point pressures.  She has failed many medications including percutaneous tibial nerve stimulation and Botox.  She is getting recurrent bladder infections.  I will check her urine again in the future to make certain she does not have microscopic hematuria.  I did not want a false positive  today on cystoscopy.     Pathophysiology of UTIs discussed.  She understands that this could be up regulating her incontinence.  I decided to get a CT scan to make certain her kidneys are normal.  I will see her in 6 weeks for cystoscopy for microhematuria and urge incontinence refractory for cystoscopy.  I will then discuss InterStim unless she down regulates.  I called and Macrodantin 100 mg 30 x11.  She takes low-dose doxycycline daily for skin condition but she still gets 4-5 bladder  infections a year.  I did not add a medication to the Macrodantin since none of them worked in the past  TOday Last culture positive.  CT scan normal.  Other positive culture was in April 2023 Patient stopped the once a day antibiotic after 1 month.  Still has urge incontinence.  Changes underclothes 5 times a day and are quite wet.  Clinically not infected today but send urine for culture.  Primary care wanted her to use some over-the-counter preparations.  She was hoping not to take long-term antibiotics.  Cystoscopy: Patient underwent flexible cystoscopy.  Bladder mucosa and trigone were normal.  No cystitis.  No carcinoma. No stress incontinence with significant hypermobility with a strong cough after cystoscopy         PMH: Past Medical History:  Diagnosis Date   Anxiety    Arthritis    OSTEOARTHRITIS   Asthma    Bipolar disorder ([place])    Cancer ([place])    BREAST   Complication of anesthesia    hard to wake up after one surgery   Constipation    COPD (chronic obstructive pulmonary disease) (HCC)    Depression    Dyspnea    with exertion   Family history of adverse reaction to anesthesia    son-n/v   Fibrocystic breast disease    GERD (gastroesophageal reflux disease)    Goiter    History of colon polyps    History of hiatal hernia    small   History of methicillin resistant staphylococcus aureus (MRSA) 2017   History of shingles    Hypothyroidism    Irritable bowel disease    Kienbock's disease    Snores    Thyromegaly    Type 2 diabetes mellitus ([place])    Urine incontinence     Surgical History: Past Surgical History:  Procedure Laterality Date   BREAST SURGERY Bilateral    MODIFIED RADICAL MASTECTOMY   COLONOSCOPY WITH PROPOFOL N/A 03/23/2017   Procedure: COLONOSCOPY WITH PROPOFOL;  Surgeon: Toledo, Benay Pike, MD;  Location: [facility] END[facility]COPY;  Service: Gastroenterology;  Laterality: N/A;   ESOPHAGOGASTRODUODENOSCOPY (EGD) WITH PROPOFOL N/A 03/23/2017    Procedure: ESOPHAGOGASTRODUODENOSCOPY (EGD) WITH PROPOFOL;  Surgeon: Toledo, Benay Pike, MD;  Location: [facility];  Service: Gastroenterology;  Laterality: N/A;   JOINT REPLACEMENT Left 04/08/2016   TOTAL SHOULDER   SHOULDER ARTHROSCOPY     x4   TONSILLECTOMY     TUBAL LIGATION     VOCAL CORD RESECTION      Home Medications:  Allergies as of 02/09/2022       Reactions   Silicone Other (See Comments)   Burns skin-ok to use paper tape Pt states this "tears her skin off"   Tape    Burns skin-ok to use paper tape   Bactrim [sulfamethoxazole-trimethoprim] Other (See Comments)   Abdominal Pain        Medication List        Accurate as of February 09, 2022  3:35 PM. If you have any questions, ask your nurse or doctor.          STOP taking these medications    cefdinir 300 MG capsule Commonly known as: OMNICEF Stopped by: Reece Packer, MD       TAKE these medications    albuterol 108 (90 Base) MCG/ACT inhaler Commonly known as: VENTOLIN HFA Inhale 2 puffs into the lungs every 6 (six) hours as needed for wheezing or shortness of breath.   albuterol (2.5 MG/3ML) 0.083% nebulizer solution Commonly known as: PROVENTIL Take 2.5 mg by nebulization every 6 (six) hours as needed for wheezing or shortness of breath.   BC FAST PAIN RELIEF PO Take 1 packet by mouth daily as needed (pain).   diclofenac Sodium 1 % Gel Commonly known as: VOLTAREN Apply 1 application topically 4 (four) times daily as needed for pain.   doxycycline 100 MG capsule Commonly known as: VIBRAMYCIN Take 100 mg by mouth 2 (two) times daily.   fluticasone 50 MCG/ACT nasal spray Commonly known as: FLONASE Place 2 sprays into both nostrils daily as needed for allergies or rhinitis.   fluticasone furoate-vilanterol 100-25 MCG/INH Aepb Commonly known as: BREO ELLIPTA Inhale 1 puff into the lungs daily as needed (shortness of breath).   HYDROcodone-acetaminophen 5-325 MG tablet Commonly  known as: NORCO/VICODIN Take 1 tablet by mouth 2 (two) times daily as needed for moderate pain.   LUBRICATING EYE DROPS OP Place 1 drop into both eyes daily as needed (dry eyes).   mometasone 0.1 % cream Commonly known as: ELOCON Apply 1 application topically daily. On bil ears   montelukast 10 MG tablet Commonly known as: SINGULAIR Take 10 mg by mouth at bedtime.   nitrofurantoin 100 MG capsule Commonly known as: MACRODANTIN Take 1 capsule (100 mg total) by mouth daily.   omeprazole 40 MG capsule Commonly known as: PRILOSEC Take 40 mg by mouth daily.   Ozempic (1 MG/DOSE) 4 MG/3ML Sopn Generic drug: Semaglutide (1 MG/DOSE) SMARTSIG:0.75 Milliliter(s) SUB-Q Once a Week   phentermine 15 MG capsule Take 15 mg by mouth every morning.   polyethylene glycol 17 g packet Commonly known as: MIRALAX / GLYCOLAX Take 17 g by mouth daily as needed for moderate constipation or mild constipation.   QUEtiapine 100 MG tablet Commonly known as: SEROQUEL Take 100 mg by mouth at bedtime. Take with 50 mg to equal 150 mg at bedtime   QUEtiapine 50 MG tablet Commonly known as: SEROQUEL Take 50 mg by mouth at bedtime. Take with 100 mg to equal 150 mg at bedtime   QUEtiapine 25 MG tablet Commonly known as: SEROQUEL Take 25 mg by mouth See admin instructions. Take 25 mg in the middle of the night as needed for sleep   rosuvastatin 5 MG tablet Commonly known as: CRESTOR Take 5 mg by mouth daily.   Vitamin D (Cholecalciferol) 10 MCG (400 UNIT) Caps Take 1,200 Units by mouth daily.        Allergies:  Allergies  Allergen Reactions   Silicone Other (See Comments)    Burns skin-ok to use paper tape Pt states this "tears her skin off"   Tape     Burns skin-ok to use paper tape   Bactrim [Sulfamethoxazole-Trimethoprim] Other (See Comments)    Abdominal Pain    Family History: No family history on file.  Social History:  reports that she quit smoking about 7 years ago. Her  smoking use included cigarettes. She has a  63.00 pack-year smoking history. She has been exposed to tobacco smoke. She has never used smokeless tobacco. She reports that she does not drink alcohol and does not use drugs.  ROS:                                        Physical Exam: BP (!) 177/87   Pulse (!) 116   Ht '5\' 1"'$  (1.549 m)   Wt 74.8 kg   LMP 03/23/2008 (Approximate)   BMI 31.18 kg/m   Constitutional:  Alert and oriented, No acute distress.   Laboratory Data: No results found for: "WBC", "HGB", "HCT", "MCV", "PLT"  Lab Results  Component Value Date   CREATININE 0.80 12/30/2021    No results found for: "PSA"  No results found for: "TESTOSTERONE"  No results found for: "HGBA1C"  Urinalysis    Component Value Date/Time   APPEARANCEUR Clear 12/22/2021 1127   GLUCOSEU Negative 12/22/2021 1127   BILIRUBINUR Negative 12/22/2021 1127   PROTEINUR Negative 12/22/2021 1127   NITRITE Negative 12/22/2021 1127   LEUKOCYTESUR 2+ (A) 12/22/2021 1127    Pertinent Imaging:   Assessment & Plan: Patient has refractory urgency incontinence affecting her quality life.  She has failed to refractory treatments.  I went over peripheral nerve evaluation and InterStim with full template.  Patient does not want to try oxybutynin and I thought this is very reasonable.  She like to go ahead with the test stimulation.  No blood thinners.  She may or may not go back on urinary prophylaxis and this was discussed.  Call if culture positive today  1. Mixed incontinence  - Urinalysis, Complete   No follow-ups on file.  Reece Packer, MD  [place] [address] [place],  [address] [phone]

## 2022-02-12 DIAGNOSIS — R200 Anesthesia of skin: Secondary | ICD-10-CM | POA: Diagnosis not present

## 2022-02-12 DIAGNOSIS — M542 Cervicalgia: Secondary | ICD-10-CM | POA: Diagnosis not present

## 2022-02-12 DIAGNOSIS — R202 Paresthesia of skin: Secondary | ICD-10-CM | POA: Diagnosis not present

## 2022-02-12 DIAGNOSIS — R519 Headache, unspecified: Secondary | ICD-10-CM | POA: Diagnosis not present

## 2022-02-12 DIAGNOSIS — G4486 Cervicogenic headache: Secondary | ICD-10-CM | POA: Diagnosis not present

## 2022-02-12 DIAGNOSIS — H539 Unspecified visual disturbance: Secondary | ICD-10-CM | POA: Diagnosis not present

## 2022-02-12 LAB — CULTURE, URINE COMPREHENSIVE

## 2022-02-19 ENCOUNTER — Other Ambulatory Visit: Payer: Self-pay | Admitting: Physician Assistant

## 2022-02-19 ENCOUNTER — Other Ambulatory Visit: Payer: Self-pay | Admitting: Pharmacy Technician

## 2022-02-19 ENCOUNTER — Other Ambulatory Visit: Payer: Self-pay

## 2022-02-19 DIAGNOSIS — G379 Demyelinating disease of central nervous system, unspecified: Secondary | ICD-10-CM

## 2022-02-19 NOTE — Patient Outreach (Signed)
Received 4 boxes of Ozempic from [employer].  Was not ordered by [place] at [facility].  Ordered by [employer] office.  Contacted provider's office to make aware that we have the insulin.  Someone from provider's office is to pick-up the insulin.  Jacquelynn Cree Patient Advocate Specialist [place] at [facility]

## 2022-02-24 DIAGNOSIS — R197 Diarrhea, unspecified: Secondary | ICD-10-CM | POA: Diagnosis not present

## 2022-02-24 DIAGNOSIS — F32A Depression, unspecified: Secondary | ICD-10-CM | POA: Diagnosis not present

## 2022-02-24 DIAGNOSIS — E892 Postprocedural hypoparathyroidism: Secondary | ICD-10-CM | POA: Diagnosis not present

## 2022-02-27 ENCOUNTER — Ambulatory Visit: Payer: Medicare HMO

## 2022-03-04 IMAGING — US US ABDOMEN COMPLETE
1 series · 13 of 25 positions shown · non-contrast
Comparison: 05/05/2019

CLINICAL DATA: Epigastric abdominal pain

EXAM:
ABDOMEN ULTRASOUND COMPLETE

[Series 1: us abdomen complete · 13 of 102 slices shown]
[im 1/102]
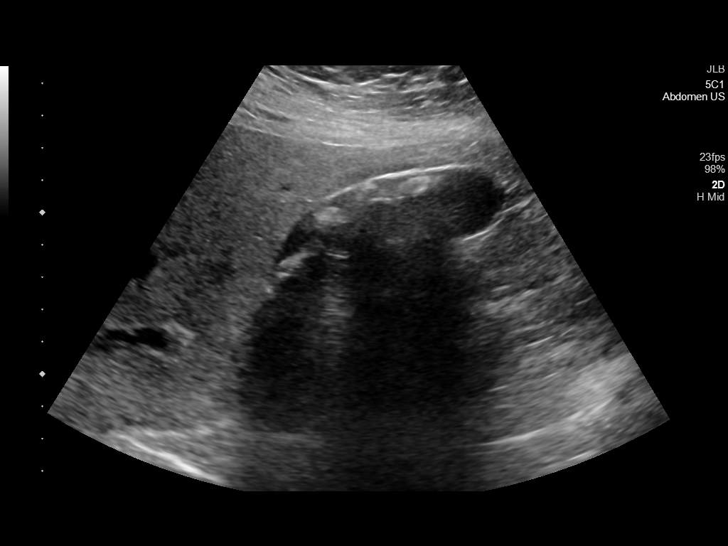
[im 9/102]
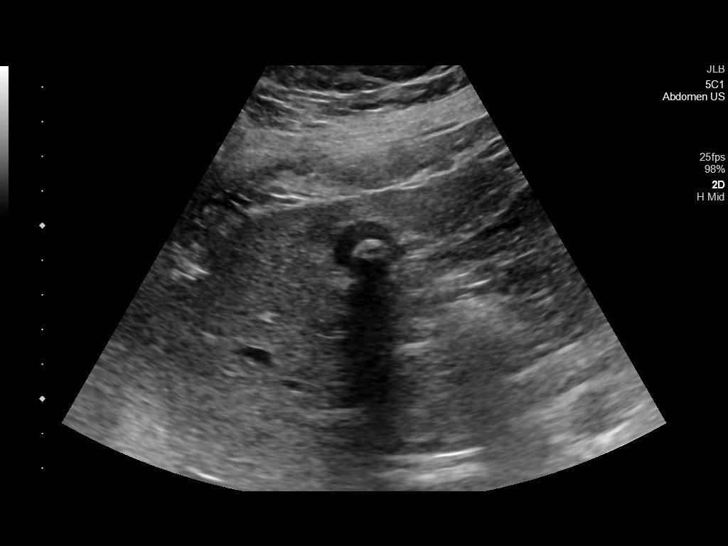
[im 17/102]
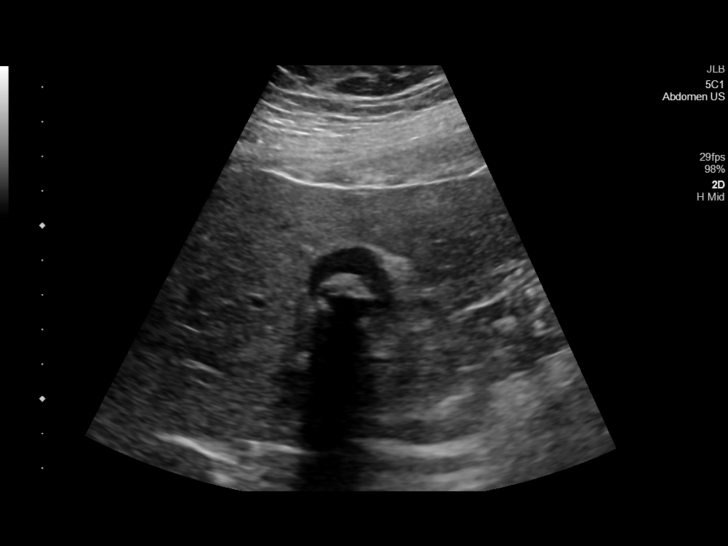
[im 26/102]
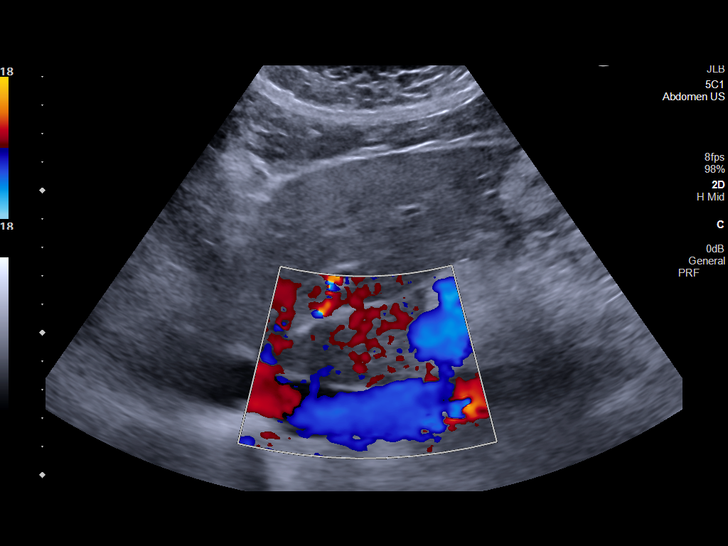
[im 34/102]
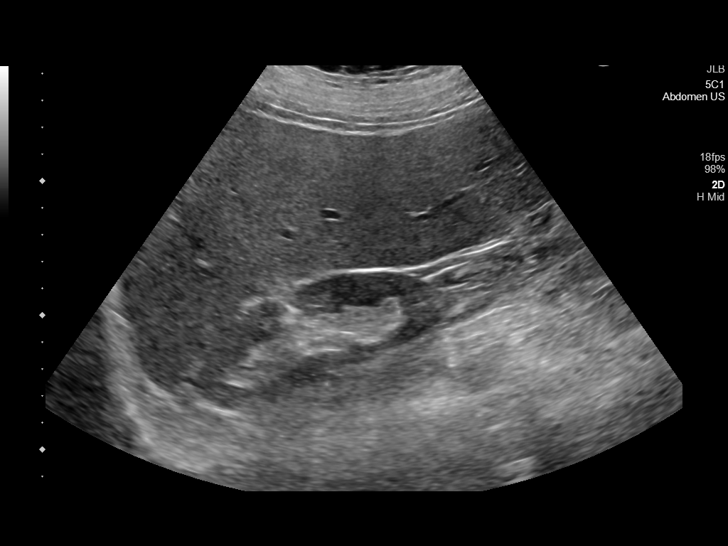
[im 43/102]
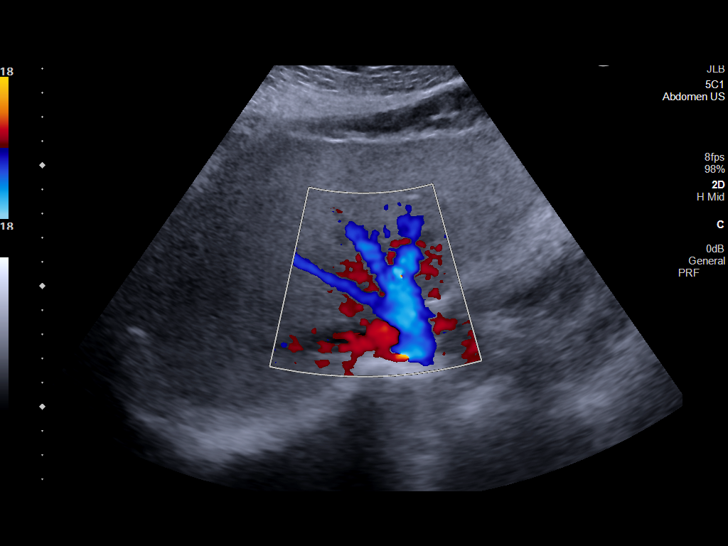
[im 51/102]
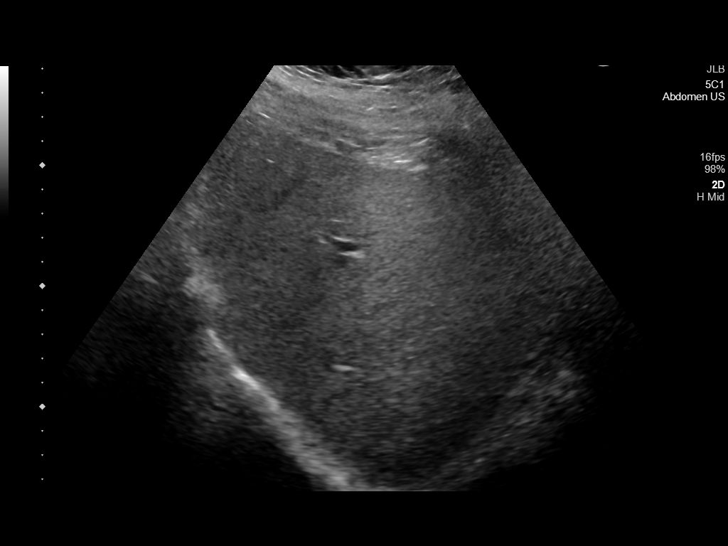
[im 59/102]
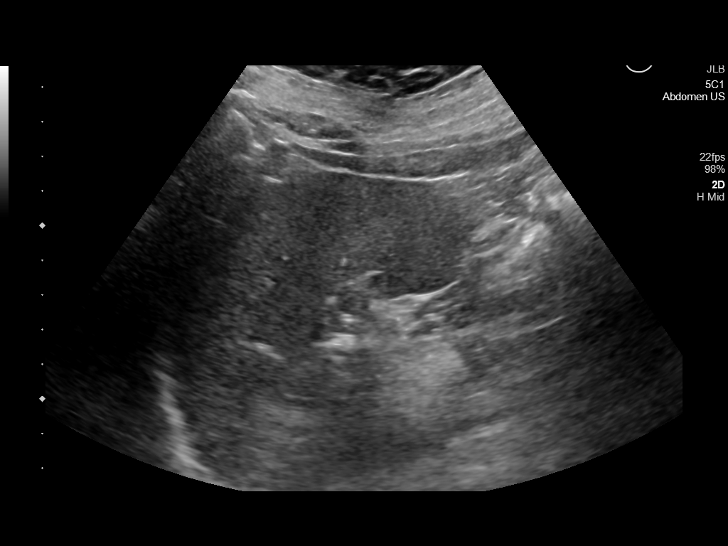
[im 68/102]
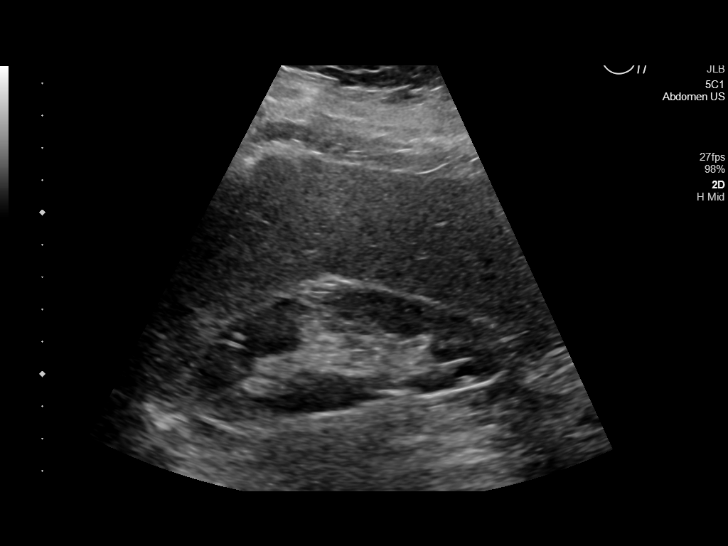
[im 76/102]
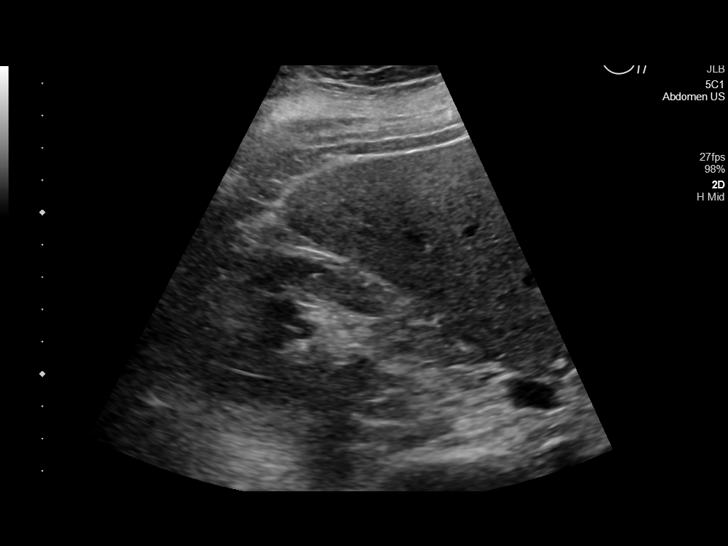
[im 85/102]
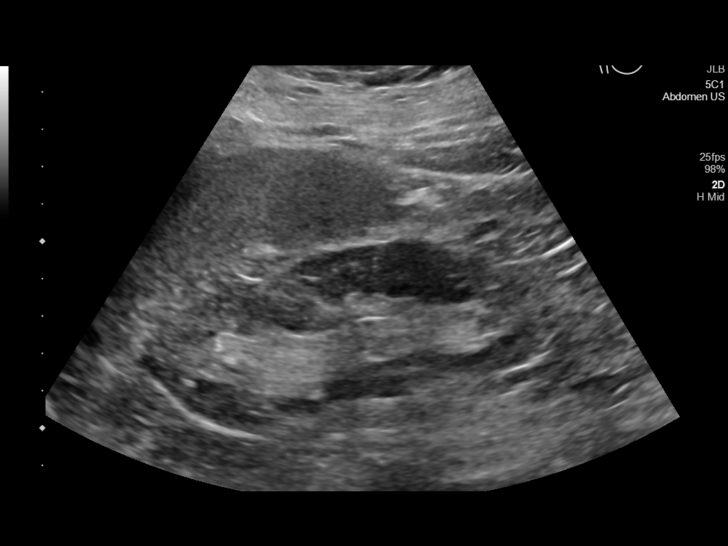
[im 93/102]
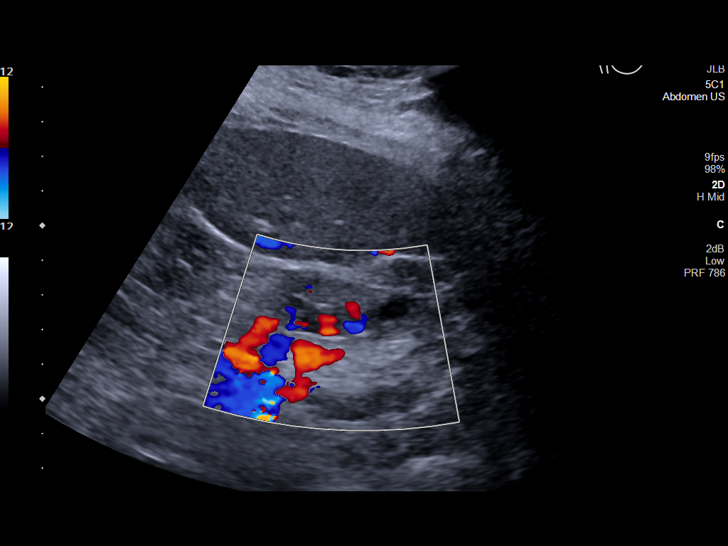
[im 102/102]
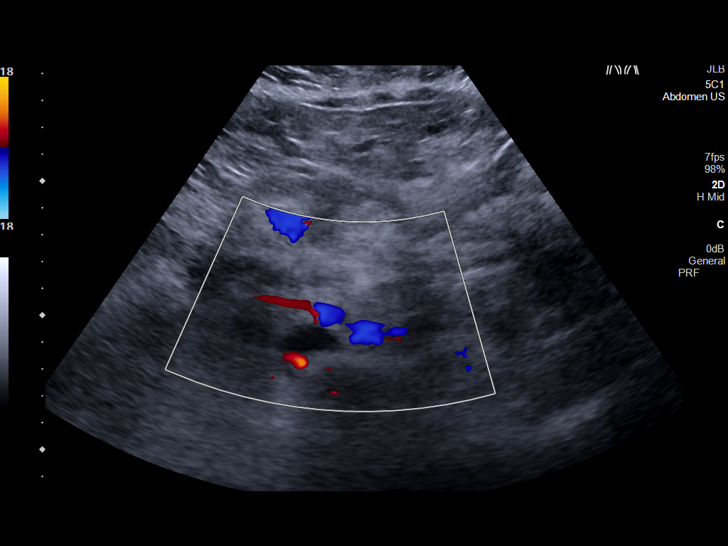

[13 of 25 positions shown; findings below may reference images not displayed]

FINDINGS: Gallbladder: Gallstones up to 2.0 cm. Some of these are non mobile,
including within the neck. No wall thickening or pericholecystic
fluid. Sonographic Murphy's sign was not elicited.

Common bile duct: Diameter: Normal, 2 mm.

Liver: No focal lesion identified. Within normal limits in
parenchymal echogenicity. Portal vein is patent on color Doppler
imaging with normal direction of blood flow towards the liver.

IVC: No abnormality visualized.

Pancreas: Visualized portion unremarkable.

Spleen: Size and appearance within normal limits.

Right Kidney: Length: 10.3 cm. No hydronephrosis. 4 mm echogenic
nonshadowing focus within the interpolar region.

Left Kidney: Length: 11.4 cm. No hydronephrosis. 4 mm echogenic
nonshadowing focus in the upper pole.

Abdominal aorta: No aneurysm visualized.

Other findings: None.
IMPRESSION: Cholelithiasis without acute cholecystitis or biliary duct
dilatation.

4 mm echogenic nonshadowing foci within both kidneys. No correlate
on the 01/01/2020 lung cancer screening CT in the upper pole left
kidney. Considerations include artifact, dystrophic calcification,
or less likely a tiny renal mass. Consider ultrasound follow-up at
12 months to confirm stability or resolution of this appearance.

## 2022-03-13 DIAGNOSIS — N3941 Urge incontinence: Secondary | ICD-10-CM | POA: Diagnosis not present

## 2022-03-20 DIAGNOSIS — N3946 Mixed incontinence: Secondary | ICD-10-CM | POA: Diagnosis not present

## 2022-03-24 DIAGNOSIS — N3941 Urge incontinence: Secondary | ICD-10-CM | POA: Diagnosis not present

## 2022-04-02 DIAGNOSIS — Z79899 Other long term (current) drug therapy: Secondary | ICD-10-CM | POA: Diagnosis not present

## 2022-04-02 DIAGNOSIS — M25519 Pain in unspecified shoulder: Secondary | ICD-10-CM | POA: Diagnosis not present

## 2022-04-02 DIAGNOSIS — M545 Low back pain, unspecified: Secondary | ICD-10-CM | POA: Diagnosis not present

## 2022-04-02 DIAGNOSIS — G894 Chronic pain syndrome: Secondary | ICD-10-CM | POA: Diagnosis not present

## 2022-04-07 DIAGNOSIS — R350 Frequency of micturition: Secondary | ICD-10-CM | POA: Diagnosis not present

## 2022-04-07 DIAGNOSIS — N3946 Mixed incontinence: Secondary | ICD-10-CM | POA: Diagnosis not present

## 2022-04-28 DIAGNOSIS — R200 Anesthesia of skin: Secondary | ICD-10-CM | POA: Diagnosis not present

## 2022-04-28 DIAGNOSIS — N3946 Mixed incontinence: Secondary | ICD-10-CM | POA: Diagnosis not present

## 2022-05-06 ENCOUNTER — Encounter: Payer: Self-pay | Admitting: Pharmacy Technician

## 2022-05-06 NOTE — Patient Outreach (Signed)
Received Ozempic from [employer].  Ordered by Dr. [facility].  Contacted Dr. Ammie Ferrier office and made aware that medication was at [place] at [facility].  Someone from [facility] is to pick-up.  Jacquelynn Cree Patient Advocate Specialist [place] at [facility]

## 2022-05-07 DIAGNOSIS — M159 Polyosteoarthritis, unspecified: Secondary | ICD-10-CM | POA: Diagnosis not present

## 2022-05-07 DIAGNOSIS — Z8739 Personal history of other diseases of the musculoskeletal system and connective tissue: Secondary | ICD-10-CM | POA: Diagnosis not present

## 2022-05-20 DIAGNOSIS — R202 Paresthesia of skin: Secondary | ICD-10-CM | POA: Diagnosis not present

## 2022-05-20 DIAGNOSIS — R200 Anesthesia of skin: Secondary | ICD-10-CM | POA: Diagnosis not present

## 2022-05-20 DIAGNOSIS — R519 Headache, unspecified: Secondary | ICD-10-CM | POA: Diagnosis not present

## 2022-05-20 DIAGNOSIS — G379 Demyelinating disease of central nervous system, unspecified: Secondary | ICD-10-CM | POA: Diagnosis not present

## 2022-05-22 ENCOUNTER — Encounter: Payer: Self-pay | Admitting: Physician Assistant

## 2022-05-22 ENCOUNTER — Other Ambulatory Visit: Payer: Self-pay

## 2022-05-22 ENCOUNTER — Other Ambulatory Visit: Payer: Self-pay | Admitting: Physician Assistant

## 2022-05-22 DIAGNOSIS — G379 Demyelinating disease of central nervous system, unspecified: Secondary | ICD-10-CM

## 2022-06-17 NOTE — Progress Notes (Signed)
Patient for DG Lumbar Puncture on Thurs 06/18/2022, I called and spoke with the patient on the phone with gave instructions. Pt was made aware to be here at 8:30a at the new entrance.  Pt stated understanding.  Called 06/17/2022

## 2022-06-18 ENCOUNTER — Ambulatory Visit
Admission: RE | Admit: 2022-06-18 | Discharge: 2022-06-18 | Disposition: A | Discharge status: Home / Self Care | Payer: PPO | Source: Ambulatory Visit | Attending: Physician Assistant | Admitting: Physician Assistant

## 2022-06-18 DIAGNOSIS — R200 Anesthesia of skin: Secondary | ICD-10-CM | POA: Diagnosis not present

## 2022-06-18 DIAGNOSIS — G379 Demyelinating disease of central nervous system, unspecified: Secondary | ICD-10-CM | POA: Diagnosis not present

## 2022-06-18 DIAGNOSIS — R202 Paresthesia of skin: Secondary | ICD-10-CM | POA: Diagnosis not present

## 2022-06-18 LAB — PROTEIN AND GLUCOSE, CSF
Glucose, CSF: 63 mg/dL (ref 40–70)
Total  Protein, CSF: 22 mg/dL (ref 15–45)

## 2022-06-18 LAB — CSF CELL COUNT WITH DIFFERENTIAL
Eosinophils, CSF: 0 % (ref 0–1)
Lymphs, CSF: 45 % (ref 40–80)
Monocyte-Macrophage-Spinal Fluid: 55 % — ABNORMAL HIGH (ref 15–45)
RBC Count, CSF: 2 /mm3 — ABNORMAL HIGH
Segmented Neutrophils-CSF: 0 % (ref 0–6)
Tube #: 3
WBC, CSF: 0 /mm3 (ref 0–5)

## 2022-06-18 MED ORDER — LIDOCAINE HCL (PF) 1 % IJ SOLN
10.0000 mL | Freq: Once | INTRAMUSCULAR | Status: AC
  Administered 2022-06-18: 10 mL

## 2022-06-18 NOTE — Progress Notes (Signed)
Patient without complaints post LP, vitals stable denies complaints at this time. Discharge instructions given.

## 2022-06-18 NOTE — Procedures (Signed)
PROCEDURE SUMMARY:  Successful fluoroscopic guided LP. Yielded 10 mL of colorless CSF fluid.  Opening pressure 15 cm of H20. No immediate complications.  Pt tolerated well.   Specimen was sent for labs.  EBL < 75m  MTsosie BillingD PA-C 06/18/2022 10:02 AM

## 2022-06-18 NOTE — Progress Notes (Signed)
Patient clinically stable post LP , tolerated well. Vitals stable. Husband at bedside. HOB flat per orders.

## 2022-06-19 LAB — CSF IGG: IgG, CSF: 2.1 mg/dL (ref 0.0–6.7)

## 2022-06-22 LAB — CSF CULTURE W GRAM STAIN: Culture: NO GROWTH

## 2022-06-23 LAB — OLIGOCLONAL BANDS, CSF + SERM

## 2022-06-30 ENCOUNTER — Ambulatory Visit: Payer: Medicare HMO | Admitting: Dermatology

## 2022-07-02 DIAGNOSIS — Z79899 Other long term (current) drug therapy: Secondary | ICD-10-CM | POA: Diagnosis not present

## 2022-07-02 DIAGNOSIS — M545 Low back pain, unspecified: Secondary | ICD-10-CM | POA: Diagnosis not present

## 2022-07-02 DIAGNOSIS — G894 Chronic pain syndrome: Secondary | ICD-10-CM | POA: Diagnosis not present

## 2022-07-02 DIAGNOSIS — M25519 Pain in unspecified shoulder: Secondary | ICD-10-CM | POA: Diagnosis not present

## 2022-07-07 DIAGNOSIS — E785 Hyperlipidemia, unspecified: Secondary | ICD-10-CM | POA: Diagnosis not present

## 2022-07-07 DIAGNOSIS — E1169 Type 2 diabetes mellitus with other specified complication: Secondary | ICD-10-CM | POA: Diagnosis not present

## 2022-07-14 DIAGNOSIS — Z0000 Encounter for general adult medical examination without abnormal findings: Secondary | ICD-10-CM | POA: Diagnosis not present

## 2022-07-14 DIAGNOSIS — I700 Atherosclerosis of aorta: Secondary | ICD-10-CM | POA: Diagnosis not present

## 2022-07-14 DIAGNOSIS — E1169 Type 2 diabetes mellitus with other specified complication: Secondary | ICD-10-CM | POA: Diagnosis not present

## 2022-07-14 DIAGNOSIS — F319 Bipolar disorder, unspecified: Secondary | ICD-10-CM | POA: Diagnosis not present

## 2022-07-14 DIAGNOSIS — E0590 Thyrotoxicosis, unspecified without thyrotoxic crisis or storm: Secondary | ICD-10-CM | POA: Diagnosis not present

## 2022-07-14 DIAGNOSIS — M659 Synovitis and tenosynovitis, unspecified: Secondary | ICD-10-CM | POA: Diagnosis not present

## 2022-07-14 DIAGNOSIS — E210 Primary hyperparathyroidism: Secondary | ICD-10-CM | POA: Diagnosis not present

## 2022-07-14 DIAGNOSIS — J449 Chronic obstructive pulmonary disease, unspecified: Secondary | ICD-10-CM | POA: Diagnosis not present

## 2022-07-14 DIAGNOSIS — E785 Hyperlipidemia, unspecified: Secondary | ICD-10-CM | POA: Diagnosis not present

## 2022-07-14 DIAGNOSIS — C50011 Malignant neoplasm of nipple and areola, right female breast: Secondary | ICD-10-CM | POA: Diagnosis not present

## 2022-07-15 DIAGNOSIS — R202 Paresthesia of skin: Secondary | ICD-10-CM | POA: Diagnosis not present

## 2022-07-15 DIAGNOSIS — R519 Headache, unspecified: Secondary | ICD-10-CM | POA: Diagnosis not present

## 2022-07-15 DIAGNOSIS — R200 Anesthesia of skin: Secondary | ICD-10-CM | POA: Diagnosis not present

## 2022-07-15 DIAGNOSIS — M542 Cervicalgia: Secondary | ICD-10-CM | POA: Diagnosis not present

## 2022-07-17 DIAGNOSIS — E0590 Thyrotoxicosis, unspecified without thyrotoxic crisis or storm: Secondary | ICD-10-CM | POA: Diagnosis not present

## 2022-07-21 DIAGNOSIS — K219 Gastro-esophageal reflux disease without esophagitis: Secondary | ICD-10-CM | POA: Diagnosis not present

## 2022-07-21 DIAGNOSIS — J449 Chronic obstructive pulmonary disease, unspecified: Secondary | ICD-10-CM | POA: Diagnosis not present

## 2022-07-21 DIAGNOSIS — F411 Generalized anxiety disorder: Secondary | ICD-10-CM | POA: Diagnosis not present

## 2022-07-21 DIAGNOSIS — E669 Obesity, unspecified: Secondary | ICD-10-CM | POA: Diagnosis not present

## 2022-07-21 DIAGNOSIS — F319 Bipolar disorder, unspecified: Secondary | ICD-10-CM | POA: Diagnosis not present

## 2022-07-21 DIAGNOSIS — J45909 Unspecified asthma, uncomplicated: Secondary | ICD-10-CM | POA: Diagnosis not present

## 2022-07-21 DIAGNOSIS — I10 Essential (primary) hypertension: Secondary | ICD-10-CM | POA: Diagnosis not present

## 2022-07-21 DIAGNOSIS — E118 Type 2 diabetes mellitus with unspecified complications: Secondary | ICD-10-CM | POA: Diagnosis not present

## 2022-07-21 DIAGNOSIS — E785 Hyperlipidemia, unspecified: Secondary | ICD-10-CM | POA: Diagnosis not present

## 2022-07-21 DIAGNOSIS — E1142 Type 2 diabetes mellitus with diabetic polyneuropathy: Secondary | ICD-10-CM | POA: Diagnosis not present

## 2022-07-21 DIAGNOSIS — E1169 Type 2 diabetes mellitus with other specified complication: Secondary | ICD-10-CM | POA: Diagnosis not present

## 2022-07-21 DIAGNOSIS — G8929 Other chronic pain: Secondary | ICD-10-CM | POA: Diagnosis not present

## 2022-07-23 DIAGNOSIS — H25813 Combined forms of age-related cataract, bilateral: Secondary | ICD-10-CM | POA: Diagnosis not present

## 2022-07-24 ENCOUNTER — Encounter: Payer: Self-pay | Admitting: Urology

## 2022-08-07 DIAGNOSIS — H2512 Age-related nuclear cataract, left eye: Secondary | ICD-10-CM | POA: Diagnosis not present

## 2022-08-12 DIAGNOSIS — Z8739 Personal history of other diseases of the musculoskeletal system and connective tissue: Secondary | ICD-10-CM | POA: Diagnosis not present

## 2022-08-12 DIAGNOSIS — M79644 Pain in right finger(s): Secondary | ICD-10-CM | POA: Diagnosis not present

## 2022-08-12 DIAGNOSIS — G8929 Other chronic pain: Secondary | ICD-10-CM | POA: Diagnosis not present

## 2022-09-25 DIAGNOSIS — H269 Unspecified cataract: Secondary | ICD-10-CM | POA: Diagnosis not present

## 2022-10-06 ENCOUNTER — Ambulatory Visit: Payer: HMO | Attending: Rheumatology | Admitting: Occupational Therapy

## 2022-10-06 ENCOUNTER — Encounter: Payer: Self-pay | Admitting: Occupational Therapy

## 2022-10-06 DIAGNOSIS — M79641 Pain in right hand: Secondary | ICD-10-CM | POA: Diagnosis present

## 2022-10-06 DIAGNOSIS — M6281 Muscle weakness (generalized): Secondary | ICD-10-CM | POA: Diagnosis present

## 2022-10-06 NOTE — Therapy (Signed)
[facility] [address] [place], [place], [address] Phone: [phone]   Fax:  [phone]  Occupational Therapy Evaluation  Patient Details  Name: Monica Hill MRN: [id number] Date of Birth: 1955/11/30 Referring Provider (OT): Defoor PA   Encounter Date: 10/06/2022   OT End of Session - 10/06/22 1420     Visit Number 1    Number of Visits 3    Date for OT Re-Evaluation 11/03/22    OT Start Time 1315    OT Stop Time 1402    OT Time Calculation (min) 47 min    Activity Tolerance Patient tolerated treatment well    Behavior During Therapy [facility] for tasks assessed/performed             Past Medical History:  Diagnosis Date   Anxiety    Arthritis    OSTEOARTHRITIS   Asthma    Bipolar disorder (HCC)    Cancer (HCC)    BREAST   Complication of anesthesia    hard to wake up after one surgery   Constipation    COPD (chronic obstructive pulmonary disease) (HCC)    Depression    Dyspnea    with exertion   Family history of adverse reaction to anesthesia    son-n/v   Fibrocystic breast disease    GERD (gastroesophageal reflux disease)    Goiter    History of colon polyps    History of hiatal hernia    small   History of methicillin resistant staphylococcus aureus (MRSA) 2017   History of shingles    Hypothyroidism    Irritable bowel disease    Kienbock's disease    Snores    Thyromegaly    Type 2 diabetes mellitus (HCC)    Urine incontinence     Past Surgical History:  Procedure Laterality Date   BREAST SURGERY Bilateral    MODIFIED RADICAL MASTECTOMY   COLONOSCOPY WITH PROPOFOL N/A 03/23/2017   Procedure: COLONOSCOPY WITH PROPOFOL;  Surgeon: Toledo, Boykin Nearing, MD;  Location: [facility] END[facility]COPY;  Service: Gastroenterology;  Laterality: N/A;   ESOPHAGOGASTRODUODENOSCOPY (EGD) WITH PROPOFOL N/A 03/23/2017   Procedure: ESOPHAGOGASTRODUODENOSCOPY (EGD) WITH PROPOFOL;  Surgeon: Toledo, Boykin Nearing, MD;   Location: [facility];  Service: Gastroenterology;  Laterality: N/A;   JOINT REPLACEMENT Left 04/08/2016   TOTAL SHOULDER   SHOULDER ARTHROSCOPY     x4   TONSILLECTOMY     TUBAL LIGATION     VOCAL CORD RESECTION      There were no vitals filed for this visit.   Subjective Assessment - 10/06/22 1412     Subjective  My thumb pain probably started about 6 months ago.  I do a lot of gardening, cooking, painting and use to work in [employer]- I cannot sit still    Pertinent History 09/12/22 Rheumathology appt- Monica Hill is a 67 y.o.female who presents today for follow up of CPPD. She was last seen 05/07/2022 and was doing poorly on colchicine and voltaren gel. We increased colchicine to bid with low threshold to use combination therapy.  Today She is doing so-so recently with ongoing right 1st MCP and a cramp recently in her right foot. She had diarrhea on colchicine twice daily so she is only taking it once daily now. She has lost some weight which makes getting up and down from the floor much more easy. She has done PT in the past but has not done OT. She got a  shot for dequervain's tenosynovitis recently which helped for all of three days before losing efficacy. She is not having significant stiffness upon waking in the morning, nor has she had any episodes of significant joint swelling.  Monica Hill has tried the following rheumatologic medications in the past: - Colchicine - current - Voltaren gel - current- Refer to OT    Patient Stated Goals I do want to have surgery.  So I want my thumb pain to get better so I can use them as long as I can    Currently in Pain? Yes    Pain Score 5     Pain Location --   thumb   Pain Orientation Right    Pain Descriptors / Indicators Aching    Pain Type Chronic pain    Aggravating Factors  bending or using it a lot               [facility] OT Assessment - 10/06/22 0001       Assessment   Medical Diagnosis R thumb pain    Referring  Provider (OT) Defoor PA    Onset Date/Surgical Date 05/01/22    Hand Dominance Right      Prior Function   Vocation Retired    [employer], gardening, painting, grandkids,      AROM   Overall AROM Comments Active range of motion for wrist within normal limits      Strength   Right Hand Grip (lbs) 45   pain thumb   Right Hand Lateral Pinch 11 lbs    Right Hand 3 Point Pinch 10 lbs   pain thumb   Left Hand Grip (lbs) 50    Left Hand Lateral Pinch 14 lbs    Left Hand 3 Point Pinch 11 lbs      Right Hand AROM   R Thumb Radial ABduction/ADduction 0-55 60    R Thumb Palmar ABduction/ADduction 0-45 60    R Thumb Opposition to Index --   pain to 5th digit -flexion out of MC , not IP                     OT Treatments/Exercises (OP) - 10/06/22 0001       RUE Paraffin   Number Minutes Paraffin 8 Minutes    RUE Paraffin Location Hand   wrist   Comments decrease pain after eval - decrease pain in thumb with flexion           After paraffin patient had decreased pain with opposition and thumb flexion  Reviewed with patient joint protection and modifications to ADLs and IADLs and handout provided Reviewed adaptive equipment to increase ease and decrease pain in thumb with ADLs and IADLs Use of moist heat Use of thumb CMC neoprene splint Patient to focus on IP flexion during opposition. Follow-up with me in 2 weeks        OT Education - 10/06/22 1419     Education Details Findings of evaluation and home program/joint protection principles    Person(s) Educated Patient    Methods Explanation;Demonstration;Tactile cues;Verbal cues;Handout    Comprehension Verbalized understanding;Returned demonstration;Verbal cues required                 OT Long Term Goals - 10/06/22 1426       OT LONG TERM GOAL #1   Title Patient be independent in home program with modifications and use of CMC neoprene to decrease pain to less than 2/10  with ADLs and IADLs     Baseline Pain with thumb flexion as well as opposition and grip and prehension 5-6/10; had a thumb spica splint in the past but noncompliant limiting her    Time 4    Period Weeks    Status New    Target Date 11/03/22      OT LONG TERM GOAL #2   Title Patient to verbalize 3 modifications or adaptive equipment that she is using at home or implementing to decrease pain and symptoms and thumb    Baseline No knowledge of adaptive equipment or modifications pain with thumb flexion as well as opposition and prehension and grip 5-6/10    Time 4    Period Weeks    Status New    Target Date 11/03/22                   Plan - 10/06/22 1421     Clinical Impression Statement Patient referred to OT with a diagnosis of chronic right thumb pain.  Patient had in the past with a trigger thumb release as well as DeQuervain with shot per pt. Pt tender over thumb CMC as well as Positive grinding test.  Pain mostly with thumb flexion as well as tension in grip 5/10 pain at thumb CMC.  Recommend for patient to get thumb CMC neoprene to use with activities during the day to decrease pain.  Also reviewed with patient modification and some adaptive equipment to decrease his pain with use and ADLs and ADLs.  Patient is active range of motion with normal limits but pain and decreased grip and prehension.  Patient can benefit from skilled OT services to decrease pain with range of motion and grip and prehension to be independent in ADLs and IADLs.    OT Occupational Profile and History Problem Focused Assessment - Including review of records relating to presenting problem    Occupational performance deficits (Please refer to evaluation for details): ADL's;IADL's;Play;Leisure;Social Participation    Body Structure / Function / Physical Skills ADL;Flexibility;ROM;UE functional use;Pain;Strength;IADL    Rehab Potential Fair    Clinical Decision Making Limited treatment options, no task modification necessary     Comorbidities Affecting Occupational Performance: --   chronic thumb pain, had trigger thumb surgery in past and DeQuervain   Modification or Assistance to Complete Evaluation  No modification of tasks or assist necessary to complete eval    OT Frequency Biweekly    OT Duration 4 weeks    OT Treatment/Interventions Self-care/ADL training;Splinting;Patient/family education;Paraffin;DME and/or AE instruction;Manual Therapy    Consulted and Agree with Plan of Care Patient             Patient will benefit from skilled therapeutic intervention in order to improve the following deficits and impairments:   Body Structure / Function / Physical Skills: ADL, Flexibility, ROM, UE functional use, Pain, Strength, IADL       Visit Diagnosis: Pain in right hand  Muscle weakness (generalized)    Problem List There are no problems to display for this patient.   Oletta Cohn, OTR/L,CLT 10/06/2022, 2:28 PM  [facility] [address], [place], [address] Phone: [phone]   Fax:  [phone]  Name: Monica Hill MRN: [id number] Date of Birth: May 27, 1956

## 2022-10-16 DIAGNOSIS — H2511 Age-related nuclear cataract, right eye: Secondary | ICD-10-CM | POA: Diagnosis not present

## 2022-10-18 ENCOUNTER — Ambulatory Visit
Admission: EM | Admit: 2022-10-18 | Discharge: 2022-10-18 | Disposition: A | Discharge status: Home / Self Care | Payer: HMO | Attending: Emergency Medicine | Admitting: Emergency Medicine

## 2022-10-18 DIAGNOSIS — R300 Dysuria: Secondary | ICD-10-CM | POA: Diagnosis present

## 2022-10-18 DIAGNOSIS — R1084 Generalized abdominal pain: Secondary | ICD-10-CM | POA: Insufficient documentation

## 2022-10-18 LAB — POCT URINALYSIS DIP (MANUAL ENTRY)
Bilirubin, UA: NEGATIVE
Glucose, UA: NEGATIVE mg/dL
Ketones, POC UA: NEGATIVE mg/dL
Nitrite, UA: NEGATIVE
Protein Ur, POC: 100 mg/dL — AB
Spec Grav, UA: 1.02 (ref 1.010–1.025)
Urobilinogen, UA: 1 E.U./dL
pH, UA: 7 (ref 5.0–8.0)

## 2022-10-18 MED ORDER — CEPHALEXIN 500 MG PO CAPS: 500.0000 mg | ORAL_CAPSULE | Freq: Two times a day (BID) | ORAL | 0 refills | Status: AC

## 2022-10-18 NOTE — Discharge Instructions (Addendum)
Go to the emergency department if you have worsening symptoms.    Take the antibiotic as directed.  The urine culture is pending.  We will call you if it shows the need to change or discontinue your antibiotic.    Follow up with your primary care provider tomorrow.    

## 2022-10-18 NOTE — ED Triage Notes (Signed)
PT her with c/o pain and pressure with urination, pt started Macrobid yesterday.pt is having fever and chills.

## 2022-10-18 NOTE — ED Provider Notes (Signed)
Renaldo Fiddler    CSN: [id number] Arrival date & time: 10/18/22  1302      History   Chief Complaint Chief Complaint  Patient presents with   Dysuria    HPI Monica Hill is a 67 y.o. female.  Patient presents with dysuria and abdominal pain x 1 day.  She started Macrobid that had been previously prescribed; 2 doses taken yesterday; none today.  She reports fever of 100.4 yesterday.  No OTC medications taken.  She denies hematuria, vomiting, diarrhea, pelvic pain, or other symptoms.  Her medical history includes diabetes, COPD, GERD, hyperlipidemia.   The history is provided by the patient and medical records.    Past Medical History:  Diagnosis Date   Anxiety    Arthritis    OSTEOARTHRITIS   Asthma    Bipolar disorder (HCC)    Cancer (HCC)    BREAST   Complication of anesthesia    hard to wake up after one surgery   Constipation    COPD (chronic obstructive pulmonary disease) (HCC)    Depression    Dyspnea    with exertion   Family history of adverse reaction to anesthesia    son-n/v   Fibrocystic breast disease    GERD (gastroesophageal reflux disease)    Goiter    History of colon polyps    History of hiatal hernia    small   History of methicillin resistant staphylococcus aureus (MRSA) 2017   History of shingles    Hypothyroidism    Irritable bowel disease    Kienbock's disease    Snores    Thyromegaly    Type 2 diabetes mellitus (HCC)    Urine incontinence     There are no problems to display for this patient.   Past Surgical History:  Procedure Laterality Date   BREAST SURGERY Bilateral    MODIFIED RADICAL MASTECTOMY   COLONOSCOPY WITH PROPOFOL N/A 03/23/2017   Procedure: COLONOSCOPY WITH PROPOFOL;  Surgeon: Toledo, Boykin Nearing, MD;  Location: [facility];  Service: Gastroenterology;  Laterality: N/A;   ESOPHAGOGASTRODUODENOSCOPY (EGD) WITH PROPOFOL N/A 03/23/2017   Procedure: ESOPHAGOGASTRODUODENOSCOPY (EGD) WITH PROPOFOL;  Surgeon:  Toledo, Boykin Nearing, MD;  Location: [facility];  Service: Gastroenterology;  Laterality: N/A;   JOINT REPLACEMENT Left 04/08/2016   TOTAL SHOULDER   SHOULDER ARTHROSCOPY     x4   TONSILLECTOMY     TUBAL LIGATION     VOCAL CORD RESECTION      OB History   No obstetric history on file.      Home Medications    Prior to Admission medications   Medication Sig Start Date End Date Taking? Authorizing Provider  albuterol (PROVENTIL HFA;VENTOLIN HFA) 108 (90 Base) MCG/ACT inhaler Inhale 2 puffs into the lungs every 6 (six) hours as needed for wheezing or shortness of breath.   Yes [provider]  albuterol (PROVENTIL) (2.5 MG/3ML) 0.083% nebulizer solution Take 2.5 mg by nebulization every 6 (six) hours as needed for wheezing or shortness of breath.   Yes [provider]  Aspirin-Caffeine (BC FAST PAIN RELIEF PO) Take 1 packet by mouth daily as needed (pain).   Yes [provider]  Carboxymethylcellul-Glycerin (LUBRICATING EYE DROPS OP) Place 1 drop into both eyes daily as needed (dry eyes).   Yes [provider]  cephALEXin (KEFLEX) 500 MG capsule Take 1 capsule (500 mg total) by mouth 2 (two) times daily for 5 days. 10/18/22 10/23/22 Yes Mickie Bail, NP  diclofenac  Sodium (VOLTAREN) 1 % GEL Apply 1 application topically 4 (four) times daily as needed for pain. 10/30/20  Yes [provider]  fluticasone (FLONASE) 50 MCG/ACT nasal spray Place 2 sprays into both nostrils daily as needed for allergies or rhinitis.   Yes [provider]  fluticasone furoate-vilanterol (BREO ELLIPTA) 100-25 MCG/INH AEPB Inhale 1 puff into the lungs daily as needed (shortness of breath).   Yes [provider]  HYDROcodone-acetaminophen (NORCO/VICODIN) 5-325 MG tablet Take 1 tablet by mouth 2 (two) times daily as needed for moderate pain.   Yes [provider]  mometasone (ELOCON) 0.1 % cream Apply 1 application topically daily. On bil ears   Yes  [provider]  montelukast (SINGULAIR) 10 MG tablet Take 10 mg by mouth at bedtime.   Yes [provider]  nitrofurantoin (MACRODANTIN) 100 MG capsule Take 1 capsule (100 mg total) by mouth daily. 12/22/21  Yes MacDiarmid, Lorin Picket, MD  omeprazole (PRILOSEC) 40 MG capsule Take 40 mg by mouth daily. 10/02/21  Yes [provider]  OZEMPIC, 1 MG/DOSE, 4 MG/3ML SOPN SMARTSIG:0.75 Milliliter(s) SUB-Q Once a Week 10/24/21  Yes [provider]  polyethylene glycol (MIRALAX / GLYCOLAX) 17 g packet Take 17 g by mouth daily as needed for moderate constipation or mild constipation.   Yes [provider]  QUEtiapine (SEROQUEL) 100 MG tablet Take 100 mg by mouth at bedtime. Take with 50 mg to equal 150 mg at bedtime   Yes [provider]  QUEtiapine (SEROQUEL) 25 MG tablet Take 25 mg by mouth See admin instructions. Take 25 mg in the middle of the night as needed for sleep   Yes [provider]  QUEtiapine (SEROQUEL) 50 MG tablet Take 50 mg by mouth at bedtime. Take with 100 mg to equal 150 mg at bedtime   Yes [provider]  rosuvastatin (CRESTOR) 5 MG tablet Take 5 mg by mouth daily. 09/27/21  Yes [provider]  Vitamin D, Cholecalciferol, 10 MCG (400 UNIT) CAPS Take 1,200 Units by mouth daily.   Yes [provider]  phentermine 15 MG capsule Take 15 mg by mouth every morning. 09/15/21   [provider]    Family History No family history on file.  Social History Social History   Tobacco Use   Smoking status: Former    Packs/day: 1.50    Years: 42.00    Additional pack years: 0.00    Total pack years: 63.00    Types: Cigarettes    Quit date: 12/31/2014    Years since quitting: 7.8    Passive exposure: Past   Smokeless tobacco: Never  Vaping Use   Vaping Use: Every day   Substances: Nicotine, Flavoring  Substance Use Topics   Alcohol use: No   Drug use: No     Allergies   Silicone, Tape, and  Bactrim [sulfamethoxazole-trimethoprim]   Review of Systems Review of Systems  Constitutional:  Negative for chills and fever.  Gastrointestinal:  Positive for abdominal pain. Negative for diarrhea, nausea and vomiting.  Genitourinary:  Positive for dysuria. Negative for flank pain, hematuria and pelvic pain.     Physical Exam Triage Vital Signs ED Triage Vitals  Enc Vitals Group     BP 10/18/22 1328 130/82     Pulse Rate 10/18/22 1328 85     Resp 10/18/22 1328 16     Temp 10/18/22 1328 99.1 F (37.3 C)     Temp Source 10/18/22 1328 Oral  SpO2 10/18/22 1328 98 %     Weight 10/18/22 1330 164 lb (74.4 kg)     Height 10/18/22 1330 5\' 1"  (1.549 m)     Head Circumference --      Peak Flow --      Pain Score 10/18/22 1330 6     Pain Loc --      Pain Edu? --      Excl. in GC? --    No data found.  Updated Vital Signs BP 130/82 (BP Location: Left Arm)   Pulse 85   Temp 99.1 F (37.3 C) (Oral)   Resp 16   Ht 5\' 1"  (1.549 m)   Wt 164 lb (74.4 kg)   LMP 03/23/2008 (Approximate)   SpO2 98%   BMI 30.99 kg/m   Visual Acuity Right Eye Distance:   Left Eye Distance:   Bilateral Distance:    Right Eye Near:   Left Eye Near:    Bilateral Near:     Physical Exam Vitals and nursing note reviewed.  Constitutional:      General: She is not in acute distress.    Appearance: Normal appearance. She is well-developed. She is not ill-appearing.  HENT:     Mouth/Throat:     Mouth: Mucous membranes are moist.  Cardiovascular:     Rate and Rhythm: Normal rate and regular rhythm.     Heart sounds: Normal heart sounds.  Pulmonary:     Effort: Pulmonary effort is normal. No respiratory distress.     Breath sounds: Normal breath sounds.  Abdominal:     General: Bowel sounds are normal.     Palpations: Abdomen is soft.     Tenderness: There is generalized abdominal tenderness. There is no right CVA tenderness, left CVA tenderness, guarding or rebound.     Comments: Mild  generalized abdominal pain to palpation.  No rebound or guarding.  No CVAT.  Musculoskeletal:     Cervical back: Neck supple.  Skin:    General: Skin is warm and dry.  Neurological:     Mental Status: She is alert.  Psychiatric:        Mood and Affect: Mood normal.        Behavior: Behavior normal.      UC Treatments / Results  Labs (all labs ordered are listed, but only abnormal results are displayed) Labs Reviewed  POCT URINALYSIS DIP (MANUAL ENTRY) - Abnormal; Notable for the following components:      Result Value   Blood, UA trace-intact (*)    Protein Ur, POC =100 (*)    Leukocytes, UA Small (1+) (*)    All other components within normal limits  URINE CULTURE    EKG   Radiology No results found.  Procedures Procedures (including critical care time)  Medications Ordered in UC Medications - No data to display  Initial Impression / Assessment and Plan / UC Course  I have reviewed the triage vital signs and the nursing notes.  Pertinent labs & imaging results that were available during my care of the patient were reviewed by me and considered in my medical decision making (see chart for details).    Dysuria, generalized abdominal pain.  Afebrile and vital signs are stable.  Patient has mild tenderness to palpation of her abdomen; No focal tenderness.  Her abdomen is soft with good bowel sounds.  Discussed limitations of evaluation of abdominal pain in an urgent care setting.  Patient declines transfer to the ED at  this time.  Treating with Keflex. Urine culture pending. Discussed with patient that we will call her if the urine culture shows the need to change or discontinue the antibiotic. Instructed her to follow-up with her PCP tomorrow.  ED precautions given. Patient agrees to plan of care.     Final Clinical Impressions(s) / UC Diagnoses   Final diagnoses:  Dysuria  Generalized abdominal pain     Discharge Instructions      Go to the emergency  department if you have worsening symptoms.    Take the antibiotic as directed.  The urine culture is pending.  We will call you if it shows the need to change or discontinue your antibiotic.    Follow up with your primary care provider tomorrow.        ED Prescriptions     Medication Sig Dispense Auth. Provider   cephALEXin (KEFLEX) 500 MG capsule Take 1 capsule (500 mg total) by mouth 2 (two) times daily for 5 days. 10 capsule Mickie Bail, NP      PDMP not reviewed this encounter.   Mickie Bail, NP 10/18/22 1406

## 2022-10-20 ENCOUNTER — Ambulatory Visit: Payer: HMO | Admitting: Occupational Therapy

## 2022-10-23 ENCOUNTER — Telehealth (HOSPITAL_COMMUNITY): Payer: Self-pay | Admitting: Emergency Medicine

## 2022-10-23 LAB — URINE CULTURE: Culture: 10000 — AB

## 2022-10-23 MED ORDER — LEVOFLOXACIN 500 MG PO TABS: 750.0000 mg | ORAL_TABLET | Freq: Every day | ORAL | 0 refills | Status: AC

## 2022-10-27 ENCOUNTER — Ambulatory Visit: Payer: HMO | Admitting: Occupational Therapy

## 2022-10-27 DIAGNOSIS — M79641 Pain in right hand: Secondary | ICD-10-CM

## 2022-10-27 DIAGNOSIS — M6281 Muscle weakness (generalized): Secondary | ICD-10-CM

## 2022-10-27 NOTE — Therapy (Signed)
[facility] [address] [place], [place], [address] Phone: [phone]   Fax:  [phone]  Occupational Therapy Treatment  Patient Details  Name: Monica Hill MRN: [id number] Date of Birth: 1955/12/22 Referring Provider (OT): Defoor PA   Encounter Date: 10/27/2022   OT End of Session - 10/27/22 1514     Visit Number 2    Number of Visits 3    Date for OT Re-Evaluation 11/03/22    OT Start Time 1402    OT Stop Time 1435    OT Time Calculation (min) 33 min    Activity Tolerance Patient tolerated treatment well    Behavior During Therapy [facility] for tasks assessed/performed             Past Medical History:  Diagnosis Date   Anxiety    Arthritis    OSTEOARTHRITIS   Asthma    Bipolar disorder (HCC)    Cancer (HCC)    BREAST   Complication of anesthesia    hard to wake up after one surgery   Constipation    COPD (chronic obstructive pulmonary disease) (HCC)    Depression    Dyspnea    with exertion   Family history of adverse reaction to anesthesia    son-n/v   Fibrocystic breast disease    GERD (gastroesophageal reflux disease)    Goiter    History of colon polyps    History of hiatal hernia    small   History of methicillin resistant staphylococcus aureus (MRSA) 2017   History of shingles    Hypothyroidism    Irritable bowel disease    Kienbock's disease    Snores    Thyromegaly    Type 2 diabetes mellitus (HCC)    Urine incontinence     Past Surgical History:  Procedure Laterality Date   BREAST SURGERY Bilateral    MODIFIED RADICAL MASTECTOMY   COLONOSCOPY WITH PROPOFOL N/A 03/23/2017   Procedure: COLONOSCOPY WITH PROPOFOL;  Surgeon: Toledo, Boykin Nearing, MD;  Location: [facility];  Service: Gastroenterology;  Laterality: N/A;   ESOPHAGOGASTRODUODENOSCOPY (EGD) WITH PROPOFOL N/A 03/23/2017   Procedure: ESOPHAGOGASTRODUODENOSCOPY (EGD) WITH PROPOFOL;  Surgeon: Toledo, Boykin Nearing, MD;   Location: [facility];  Service: Gastroenterology;  Laterality: N/A;   JOINT REPLACEMENT Left 04/08/2016   TOTAL SHOULDER   SHOULDER ARTHROSCOPY     x4   TONSILLECTOMY     TUBAL LIGATION     VOCAL CORD RESECTION      There were no vitals filed for this visit.   Subjective Assessment - 10/27/22 1512     Subjective  I have been modifying and pain attention how I do things.  I can tell my pain is better.  Did not take any pain medicine since  seeing you.  And I cook for 10 people yesterday.    Pertinent History 09/12/22 Rheumathology appt- Monica Hill is a 67 y.o.female who presents today for follow up of CPPD. She was last seen 05/07/2022 and was doing poorly on colchicine and voltaren gel. We increased colchicine to bid with low threshold to use combination therapy.  Today She is doing so-so recently with ongoing right 1st MCP and a cramp recently in her right foot. She had diarrhea on colchicine twice daily so she is only taking it once daily now. She has lost some weight which makes getting up and down from the floor much more easy. She has done PT in the past  but has not done OT. She got a shot for dequervain's tenosynovitis recently which helped for all of three days before losing efficacy. She is not having significant stiffness upon waking in the morning, nor has she had any episodes of significant joint swelling.  [name] has tried the following rheumatologic medications in the past: - Colchicine - current - Voltaren gel - current- Refer to OT    Patient Stated Goals I do want to have surgery.  So I want my thumb pain to get better so I can use them as long as I can    Currently in Pain? Yes    Pain Score 1     Pain Location --   thumb   Pain Orientation Right    Pain Descriptors / Indicators Aching    Pain Type Chronic pain    Pain Onset More than a month ago    Pain Frequency Occasional                          OT Treatments/Exercises (OP) - 10/27/22  0001       RUE Paraffin   Number Minutes Paraffin 8 Minutes    RUE Paraffin Location Hand    Comments decrease stiffness and pian             After paraffin patient had decreased pain and stiffness. Educated patient on when to do contrast when having a flareup or swelling or edema.   Moist heat with stiffness and pain.    Reviewe again with patient joint protection and modifications to ADLs and IADLs and handout provided last time Reviewed adaptive equipment to increase ease and decrease pain in thumb with ADLs and IADLs Patient with decreased pain this date as well as normal active range of motion and strength for age. Use of thumb CMC neoprene splint-for the patient with a medium and educated on wearing and use. Patient with increased opposition with less pain Reinforced with patient to continue with passive range of motion to thumb IP and MC prior to opposition focusing on oval 8 reps Can do soft tissue massage to webspace of thumb with palmar /radial abduction to decrease risk of tendinitis Patient to continue at home can contact me if needed     OT Education - 10/27/22 1513     Education Details Progress and home exercises as well as joint protection and adaptive equipment    Person(s) Educated Patient    Methods Explanation;Demonstration;Tactile cues;Verbal cues;Handout    Comprehension Verbalized understanding;Returned demonstration;Verbal cues required                 OT Long Term Goals - 10/27/22 1515       OT LONG TERM GOAL #1   Title Patient be independent in home program with modifications and use of CMC neoprene to decrease pain to less than 2/10 with ADLs and IADLs    Status Achieved      OT LONG TERM GOAL #2   Title Patient to verbalize 3 modifications or adaptive equipment that she is using at home or implementing to decrease pain and symptoms and thumb    Status Achieved                   Plan - 10/27/22 1515     Clinical  Impression Statement Patient referred to OT with a diagnosis of chronic right thumb pain.  Patient had in the past with a trigger  thumb release as well as DeQuervain with shot per pt. Pt tender over thumb CMC as well as Positive grinding test.  Patient arrived this date with less pain since evaluation.  Not taking any pain medicine.  Patient attempted to start modifying and paying attention how she uses her hand-doing joint protection and modifications as well as adaptive equipment.  Patient with less pain and active range of motion and strength in wrist and thumb as well as grip and prehension.  Patient was fitted with a CMC neoprene to use with activities that she cannot modify or adapt.  Reviewed again with patient modification and some adaptive equipment to decrease her pain with use and ADLs and ADLs.  Patient continue at home with home program and can contact me if needed otherwise we will discharge.    OT Occupational Profile and History Problem Focused Assessment - Including review of records relating to presenting problem    Occupational performance deficits (Please refer to evaluation for details): ADL's;IADL's;Play;Leisure;Social Participation    Body Structure / Function / Physical Skills ADL;Flexibility;ROM;UE functional use;Pain;Strength;IADL    Rehab Potential Fair    Clinical Decision Making Limited treatment options, no task modification necessary    Comorbidities Affecting Occupational Performance: --   chronic thumb pain, had dequervain in past , trigger thumb release   Modification or Assistance to Complete Evaluation  No modification of tasks or assist necessary to complete eval    OT Frequency Biweekly    OT Duration 2 weeks    OT Treatment/Interventions Self-care/ADL training;Splinting;Patient/family education;Paraffin;DME and/or AE instruction;Manual Therapy    Consulted and Agree with Plan of Care Patient             Patient will benefit from skilled therapeutic  intervention in order to improve the following deficits and impairments:   Body Structure / Function / Physical Skills: ADL, Flexibility, ROM, UE functional use, Pain, Strength, IADL       Visit Diagnosis: Pain in right hand  Muscle weakness (generalized)    Problem List There are no problems to display for this patient.   Oletta Cohn, OTR/L,CLT 10/27/2022, 3:19 PM  [facility] [address], [place], [address] Phone: [phone]   Fax:  [phone]  Name: ARISBET DETERDING MRN: [id number] Date of Birth: 1955-10-16

## 2023-01-06 DIAGNOSIS — E1169 Type 2 diabetes mellitus with other specified complication: Secondary | ICD-10-CM | POA: Diagnosis not present

## 2023-01-06 DIAGNOSIS — E538 Deficiency of other specified B group vitamins: Secondary | ICD-10-CM | POA: Diagnosis not present

## 2023-01-06 DIAGNOSIS — E785 Hyperlipidemia, unspecified: Secondary | ICD-10-CM | POA: Diagnosis not present

## 2023-01-13 DIAGNOSIS — C50011 Malignant neoplasm of nipple and areola, right female breast: Secondary | ICD-10-CM | POA: Diagnosis not present

## 2023-01-13 DIAGNOSIS — E0590 Thyrotoxicosis, unspecified without thyrotoxic crisis or storm: Secondary | ICD-10-CM | POA: Diagnosis not present

## 2023-01-13 DIAGNOSIS — E782 Mixed hyperlipidemia: Secondary | ICD-10-CM | POA: Diagnosis not present

## 2023-01-13 DIAGNOSIS — M79641 Pain in right hand: Secondary | ICD-10-CM | POA: Diagnosis not present

## 2023-01-13 DIAGNOSIS — M542 Cervicalgia: Secondary | ICD-10-CM | POA: Diagnosis not present

## 2023-01-13 DIAGNOSIS — R519 Headache, unspecified: Secondary | ICD-10-CM | POA: Diagnosis not present

## 2023-01-13 DIAGNOSIS — G8929 Other chronic pain: Secondary | ICD-10-CM | POA: Diagnosis not present

## 2023-01-13 DIAGNOSIS — E785 Hyperlipidemia, unspecified: Secondary | ICD-10-CM | POA: Diagnosis not present

## 2023-01-13 DIAGNOSIS — Z0000 Encounter for general adult medical examination without abnormal findings: Secondary | ICD-10-CM | POA: Diagnosis not present

## 2023-01-13 DIAGNOSIS — M79644 Pain in right finger(s): Secondary | ICD-10-CM | POA: Diagnosis not present

## 2023-01-13 DIAGNOSIS — E1169 Type 2 diabetes mellitus with other specified complication: Secondary | ICD-10-CM | POA: Diagnosis not present

## 2023-02-04 DIAGNOSIS — M1811 Unilateral primary osteoarthritis of first carpometacarpal joint, right hand: Secondary | ICD-10-CM | POA: Diagnosis not present

## 2023-02-24 DIAGNOSIS — Z5181 Encounter for therapeutic drug level monitoring: Secondary | ICD-10-CM | POA: Diagnosis not present

## 2023-02-24 DIAGNOSIS — G894 Chronic pain syndrome: Secondary | ICD-10-CM | POA: Diagnosis not present

## 2023-02-24 DIAGNOSIS — M25519 Pain in unspecified shoulder: Secondary | ICD-10-CM | POA: Diagnosis not present

## 2023-02-24 DIAGNOSIS — M545 Low back pain, unspecified: Secondary | ICD-10-CM | POA: Diagnosis not present

## 2023-02-24 DIAGNOSIS — Z79899 Other long term (current) drug therapy: Secondary | ICD-10-CM | POA: Diagnosis not present

## 2023-02-25 DIAGNOSIS — E042 Nontoxic multinodular goiter: Secondary | ICD-10-CM | POA: Diagnosis not present

## 2023-02-25 DIAGNOSIS — E0590 Thyrotoxicosis, unspecified without thyrotoxic crisis or storm: Secondary | ICD-10-CM | POA: Diagnosis not present

## 2023-03-02 DIAGNOSIS — R8271 Bacteriuria: Secondary | ICD-10-CM | POA: Diagnosis not present

## 2023-03-02 DIAGNOSIS — N3946 Mixed incontinence: Secondary | ICD-10-CM | POA: Diagnosis not present

## 2023-03-18 DIAGNOSIS — Z8739 Personal history of other diseases of the musculoskeletal system and connective tissue: Secondary | ICD-10-CM | POA: Diagnosis not present

## 2023-03-18 DIAGNOSIS — M150 Primary generalized (osteo)arthritis: Secondary | ICD-10-CM | POA: Diagnosis not present

## 2023-04-06 DIAGNOSIS — E0590 Thyrotoxicosis, unspecified without thyrotoxic crisis or storm: Secondary | ICD-10-CM | POA: Diagnosis not present

## 2023-04-13 DIAGNOSIS — Z9889 Other specified postprocedural states: Secondary | ICD-10-CM | POA: Diagnosis not present

## 2023-04-13 DIAGNOSIS — E042 Nontoxic multinodular goiter: Secondary | ICD-10-CM | POA: Diagnosis not present

## 2023-04-13 DIAGNOSIS — M816 Localized osteoporosis [Lequesne]: Secondary | ICD-10-CM | POA: Diagnosis not present

## 2023-04-13 DIAGNOSIS — E0590 Thyrotoxicosis, unspecified without thyrotoxic crisis or storm: Secondary | ICD-10-CM | POA: Diagnosis not present

## 2023-05-19 DIAGNOSIS — M545 Low back pain, unspecified: Secondary | ICD-10-CM | POA: Diagnosis not present

## 2023-05-19 DIAGNOSIS — G894 Chronic pain syndrome: Secondary | ICD-10-CM | POA: Diagnosis not present

## 2023-05-19 DIAGNOSIS — M25512 Pain in left shoulder: Secondary | ICD-10-CM | POA: Diagnosis not present

## 2023-05-19 DIAGNOSIS — Z79899 Other long term (current) drug therapy: Secondary | ICD-10-CM | POA: Diagnosis not present

## 2023-05-21 DIAGNOSIS — G5601 Carpal tunnel syndrome, right upper limb: Secondary | ICD-10-CM | POA: Diagnosis not present

## 2023-06-29 DIAGNOSIS — Z8739 Personal history of other diseases of the musculoskeletal system and connective tissue: Secondary | ICD-10-CM | POA: Diagnosis not present

## 2023-06-29 DIAGNOSIS — M150 Primary generalized (osteo)arthritis: Secondary | ICD-10-CM | POA: Diagnosis not present

## 2023-07-01 DIAGNOSIS — M150 Primary generalized (osteo)arthritis: Secondary | ICD-10-CM | POA: Diagnosis not present

## 2023-07-01 DIAGNOSIS — Z8739 Personal history of other diseases of the musculoskeletal system and connective tissue: Secondary | ICD-10-CM | POA: Diagnosis not present

## 2023-07-01 DIAGNOSIS — E0590 Thyrotoxicosis, unspecified without thyrotoxic crisis or storm: Secondary | ICD-10-CM | POA: Diagnosis not present

## 2023-07-12 DIAGNOSIS — E0590 Thyrotoxicosis, unspecified without thyrotoxic crisis or storm: Secondary | ICD-10-CM | POA: Diagnosis not present

## 2023-07-12 DIAGNOSIS — M816 Localized osteoporosis [Lequesne]: Secondary | ICD-10-CM | POA: Diagnosis not present

## 2023-07-26 DIAGNOSIS — Z0000 Encounter for general adult medical examination without abnormal findings: Secondary | ICD-10-CM | POA: Diagnosis not present

## 2023-07-26 DIAGNOSIS — E1169 Type 2 diabetes mellitus with other specified complication: Secondary | ICD-10-CM | POA: Diagnosis not present

## 2023-07-26 DIAGNOSIS — F3341 Major depressive disorder, recurrent, in partial remission: Secondary | ICD-10-CM | POA: Diagnosis not present

## 2023-07-26 DIAGNOSIS — R7989 Other specified abnormal findings of blood chemistry: Secondary | ICD-10-CM | POA: Diagnosis not present

## 2023-07-26 DIAGNOSIS — E210 Primary hyperparathyroidism: Secondary | ICD-10-CM | POA: Diagnosis not present

## 2023-07-26 DIAGNOSIS — F319 Bipolar disorder, unspecified: Secondary | ICD-10-CM | POA: Diagnosis not present

## 2023-07-26 DIAGNOSIS — C50011 Malignant neoplasm of nipple and areola, right female breast: Secondary | ICD-10-CM | POA: Diagnosis not present

## 2023-07-26 DIAGNOSIS — J449 Chronic obstructive pulmonary disease, unspecified: Secondary | ICD-10-CM | POA: Diagnosis not present

## 2023-07-26 DIAGNOSIS — I700 Atherosclerosis of aorta: Secondary | ICD-10-CM | POA: Diagnosis not present

## 2023-07-26 DIAGNOSIS — E785 Hyperlipidemia, unspecified: Secondary | ICD-10-CM | POA: Diagnosis not present

## 2023-08-16 DIAGNOSIS — Z853 Personal history of malignant neoplasm of breast: Secondary | ICD-10-CM | POA: Diagnosis not present

## 2023-08-16 DIAGNOSIS — T8543XA Leakage of breast prosthesis and implant, initial encounter: Secondary | ICD-10-CM | POA: Diagnosis not present

## 2023-08-25 DIAGNOSIS — M150 Primary generalized (osteo)arthritis: Secondary | ICD-10-CM | POA: Diagnosis not present

## 2023-08-25 DIAGNOSIS — Z8739 Personal history of other diseases of the musculoskeletal system and connective tissue: Secondary | ICD-10-CM | POA: Diagnosis not present

## 2023-08-25 DIAGNOSIS — Z79899 Other long term (current) drug therapy: Secondary | ICD-10-CM | POA: Diagnosis not present

## 2023-09-07 DIAGNOSIS — N3946 Mixed incontinence: Secondary | ICD-10-CM | POA: Diagnosis not present

## 2023-09-09 DIAGNOSIS — G894 Chronic pain syndrome: Secondary | ICD-10-CM | POA: Diagnosis not present

## 2023-09-09 DIAGNOSIS — M25512 Pain in left shoulder: Secondary | ICD-10-CM | POA: Diagnosis not present

## 2023-09-09 DIAGNOSIS — Z79899 Other long term (current) drug therapy: Secondary | ICD-10-CM | POA: Diagnosis not present

## 2023-09-09 DIAGNOSIS — M545 Low back pain, unspecified: Secondary | ICD-10-CM | POA: Diagnosis not present

## 2023-09-14 DIAGNOSIS — G5601 Carpal tunnel syndrome, right upper limb: Secondary | ICD-10-CM | POA: Diagnosis not present

## 2023-09-14 DIAGNOSIS — M1811 Unilateral primary osteoarthritis of first carpometacarpal joint, right hand: Secondary | ICD-10-CM | POA: Diagnosis not present

## 2023-09-27 DIAGNOSIS — Z4789 Encounter for other orthopedic aftercare: Secondary | ICD-10-CM | POA: Diagnosis not present

## 2023-10-01 DIAGNOSIS — Z4789 Encounter for other orthopedic aftercare: Secondary | ICD-10-CM | POA: Diagnosis not present

## 2023-10-05 DIAGNOSIS — E0590 Thyrotoxicosis, unspecified without thyrotoxic crisis or storm: Secondary | ICD-10-CM | POA: Diagnosis not present

## 2023-10-05 DIAGNOSIS — M810 Age-related osteoporosis without current pathological fracture: Secondary | ICD-10-CM | POA: Diagnosis not present

## 2023-10-05 DIAGNOSIS — Z8739 Personal history of other diseases of the musculoskeletal system and connective tissue: Secondary | ICD-10-CM | POA: Diagnosis not present

## 2023-10-12 DIAGNOSIS — E0590 Thyrotoxicosis, unspecified without thyrotoxic crisis or storm: Secondary | ICD-10-CM | POA: Diagnosis not present

## 2023-10-12 DIAGNOSIS — M816 Localized osteoporosis [Lequesne]: Secondary | ICD-10-CM | POA: Diagnosis not present

## 2023-10-14 DIAGNOSIS — M25641 Stiffness of right hand, not elsewhere classified: Secondary | ICD-10-CM | POA: Diagnosis not present

## 2023-10-14 DIAGNOSIS — Z4789 Encounter for other orthopedic aftercare: Secondary | ICD-10-CM | POA: Diagnosis not present

## 2023-10-21 ENCOUNTER — Encounter: Payer: Self-pay | Admitting: Occupational Therapy

## 2023-10-21 ENCOUNTER — Ambulatory Visit: Attending: Orthopedic Surgery | Admitting: Occupational Therapy

## 2023-10-21 ENCOUNTER — Ambulatory Visit: Admitting: Occupational Therapy

## 2023-10-21 DIAGNOSIS — M25631 Stiffness of right wrist, not elsewhere classified: Secondary | ICD-10-CM | POA: Diagnosis not present

## 2023-10-21 DIAGNOSIS — M79641 Pain in right hand: Secondary | ICD-10-CM | POA: Insufficient documentation

## 2023-10-21 DIAGNOSIS — M25641 Stiffness of right hand, not elsewhere classified: Secondary | ICD-10-CM | POA: Insufficient documentation

## 2023-10-21 DIAGNOSIS — M6281 Muscle weakness (generalized): Secondary | ICD-10-CM | POA: Insufficient documentation

## 2023-10-21 DIAGNOSIS — L905 Scar conditions and fibrosis of skin: Secondary | ICD-10-CM | POA: Insufficient documentation

## 2023-10-21 NOTE — Therapy (Deleted)
 OUTPATIENT OCCUPATIONAL THERAPY ORTHO EVALUATION  Patient Name: Monica Hill MRN: [id number] DOB:30-Nov-1955, 68 y.o., female Today's Date: 10/21/2023  PCP: Dr Firman Pilley REFERRING PROVIDER: DR Aloha Arnold  END OF SESSION:   Past Medical History:  Diagnosis Date   Anxiety    Arthritis    OSTEOARTHRITIS   Asthma    Bipolar disorder (HCC)    Cancer (HCC)    BREAST   Complication of anesthesia    hard to wake up after one surgery   Constipation    COPD (chronic obstructive pulmonary disease) (HCC)    Depression    Dyspnea    with exertion   Family history of adverse reaction to anesthesia    son-n/v   Fibrocystic breast disease    GERD (gastroesophageal reflux disease)    Goiter    History of colon polyps    History of hiatal hernia    small   History of methicillin resistant staphylococcus aureus (MRSA) 2017   History of shingles    Hypothyroidism    Irritable bowel disease    Kienbock's disease    Snores    Thyromegaly    Type 2 diabetes mellitus (HCC)    Urine incontinence    Past Surgical History:  Procedure Laterality Date   BREAST SURGERY Bilateral    MODIFIED RADICAL MASTECTOMY   COLONOSCOPY WITH PROPOFOL  N/A 03/23/2017   Procedure: COLONOSCOPY WITH PROPOFOL ;  Surgeon: Toledo, Alphonsus Jeans, MD;  Location: [facility];  Service: Gastroenterology;  Laterality: N/A;   ESOPHAGOGASTRODUODENOSCOPY (EGD) WITH PROPOFOL  N/A 03/23/2017   Procedure: ESOPHAGOGASTRODUODENOSCOPY (EGD) WITH PROPOFOL ;  Surgeon: Toledo, Alphonsus Jeans, MD;  Location: [facility];  Service: Gastroenterology;  Laterality: N/A;   JOINT REPLACEMENT Left 04/08/2016   TOTAL SHOULDER   SHOULDER ARTHROSCOPY     x4   TONSILLECTOMY     TUBAL LIGATION     VOCAL CORD RESECTION     There are no active problems to display for this patient.   ONSET DATE: 09/14/23  REFERRING DIAG: R CMC arthroplasty and CTR  THERAPY DIAG:  No diagnosis found.  Rationale for Evaluation and Treatment:  Rehabilitation  SUBJECTIVE:   SUBJECTIVE STATEMENT: *** Pt accompanied by: self  PERTINENT HISTORY: ***  PRECAUTIONS: CMC arthroplasty protocol    WEIGHT BEARING RESTRICTIONS: No  PAIN:  Are you having pain?   FALLS: Has patient fallen in last 6 months? No  LIVING ENVIRONMENT: Lives with: {[facility] lives with:25569::"lives with their family"}    PLOF: {PLOF:24004}  PATIENT GOALS: ***  NEXT MD VISIT: ***  OBJECTIVE:  Note: Objective measures were completed at Evaluation unless otherwise noted.  HAND DOMINANCE: Right  ADLs:   FUNCTIONAL OUTCOME MEASURES: To be done next session  UPPER EXTREMITY ROM:     Active ROM Right eval Left eval  Shoulder flexion    Shoulder abduction    Shoulder adduction    Shoulder extension    Shoulder internal rotation    Shoulder external rotation    Elbow flexion    Elbow extension    Wrist flexion    Wrist extension    Wrist ulnar deviation    Wrist radial deviation    Wrist pronation    Wrist supination    (Blank rows = not tested)  Active ROM Right eval Left eval  Thumb MCP (0-60)    Thumb IP (0-80)    Thumb Radial abd/add (0-55)     Thumb Palmar abd/add (0-45)     Thumb Opposition to Small Finger  Index MCP (0-90)     Index PIP (0-100)     Index DIP (0-70)      Long MCP (0-90)      Long PIP (0-100)      Long DIP (0-70)      Ring MCP (0-90)      Ring PIP (0-100)      Ring DIP (0-70)      Little MCP (0-90)      Little PIP (0-100)      Little DIP (0-70)      (Blank rows = not tested)   UPPER EXTREMITY M  HAND FUNCTION: Grip strength: Right: *** lbs; Left: *** lbs, Lateral pinch: Right: *** lbs, Left: *** lbs, and 3 point pinch: Right: *** lbs, Left: *** lbs  COORDINATION: {otcoordination:27237}  SENSATION: {sensation:27233}  EDEMA: ***  COGNITION: Overall cognitive status: {cognition:24006} Areas of impairment: {impairedcognition:27234}  OBSERVATIONS: ***   TREATMENT DATE: 10/21/23                                                                                                                             Modalities: {OPRCMODALITIES:31717}     PATIENT EDUCATION: Education details: findings of eval and HEP  Person educated: Patient Education method: Explanation, Demonstration, Tactile cues, Verbal cues, and Handouts Education comprehension: verbalized understanding, returned demonstration, verbal cues required, and needs further education    GOALS: Goals reviewed with patient? Yes    LONG TERM GOALS: Target date: ***  *** Baseline:  Goal status: INITIAL  2.  *** Baseline:  Goal status: INITIAL  3.  *** Baseline:  Goal status: INITIAL  4.  *** Baseline:  Goal status: INITIAL  5.  *** Baseline:  Goal status: INITIAL  6.  *** Baseline:  Goal status: INITIAL   ASSESSMENT:  CLINICAL IMPRESSION: Patient seen today for occupational therapy evaluation for ***.   PERFORMANCE DEFICITS: in functional skills including ADLs, IADLs, ROM, strength, pain, flexibility, decreased knowledge of use of DME, and UE functional use,   and psychosocial skills including environmental adaptation and routines and behaviors.   IMPAIRMENTS: are limiting patient from ADLs, IADLs, rest and sleep, play, leisure, and social participation.   COMORBIDITIES: has no other co-morbidities that affects occupational performance. Patient will benefit from skilled OT to address above impairments and improve overall function.  MODIFICATION OR ASSISTANCE TO COMPLETE EVALUATION: No modification of tasks or assist necessary to complete an evaluation.  OT OCCUPATIONAL PROFILE AND HISTORY: Problem focused assessment: Including review of records relating to presenting problem.  CLINICAL DECISION MAKING: LOW - limited treatment options, no task modification necessary  REHAB POTENTIAL: Good for goals  EVALUATION COMPLEXITY: Low   PLAN:  OT FREQUENCY: 1-2x/week  OT DURATION: 8  weeks  PLANNED INTERVENTIONS: 97168 OT Re-evaluation, 97535 self care/ADL training, 16109 therapeutic exercise, 97530 therapeutic activity, 97140 manual therapy, 97018 paraffin, 60454 fluidotherapy, 97034 contrast bath, 97763 Orthotic/Prosthetic subsequent, scar mobilization, passive range of motion, patient/family education, and DME and/or  AE instructions    CONSULTED AND AGREED WITH PLAN OF CARE: Patient     Heloise Lobo, OTR/L,CLT 10/21/2023, 1:12 PM

## 2023-10-21 NOTE — Therapy (Signed)
 OUTPATIENT OCCUPATIONAL THERAPY ORTHO EVALUATION  Patient Name: Monica Hill MRN: [id number] DOB:1956-03-21, 68 y.o., female Today's Date: 10/21/2023  PCP: Dr Firman Belfiore REFERRING PROVIDER: DR Aloha Arnold  END OF SESSION:  OT End of Session - 10/21/23 1954     Visit Number 1    Number of Visits 12    Date for OT Re-Evaluation 12/16/23    OT Start Time 1321    OT Stop Time 1405    OT Time Calculation (min) 44 min    Activity Tolerance Patient tolerated treatment well    Behavior During Therapy WFL for tasks assessed/performed             Past Medical History:  Diagnosis Date   Anxiety    Arthritis    OSTEOARTHRITIS   Asthma    Bipolar disorder (HCC)    Cancer (HCC)    BREAST   Complication of anesthesia    hard to wake up after one surgery   Constipation    COPD (chronic obstructive pulmonary disease) (HCC)    Depression    Dyspnea    with exertion   Family history of adverse reaction to anesthesia    son-n/v   Fibrocystic breast disease    GERD (gastroesophageal reflux disease)    Goiter    History of colon polyps    History of hiatal hernia    small   History of methicillin resistant staphylococcus aureus (MRSA) 2017   History of shingles    Hypothyroidism    Irritable bowel disease    Kienbock's disease    Snores    Thyromegaly    Type 2 diabetes mellitus (HCC)    Urine incontinence    Past Surgical History:  Procedure Laterality Date   BREAST SURGERY Bilateral    MODIFIED RADICAL MASTECTOMY   COLONOSCOPY WITH PROPOFOL  N/A 03/23/2017   Procedure: COLONOSCOPY WITH PROPOFOL ;  Surgeon: Toledo, Alphonsus Jeans, MD;  Location: [facility];  Service: Gastroenterology;  Laterality: N/A;   ESOPHAGOGASTRODUODENOSCOPY (EGD) WITH PROPOFOL  N/A 03/23/2017   Procedure: ESOPHAGOGASTRODUODENOSCOPY (EGD) WITH PROPOFOL ;  Surgeon: Toledo, Alphonsus Jeans, MD;  Location: [facility];  Service: Gastroenterology;  Laterality: N/A;   JOINT REPLACEMENT Left 04/08/2016   TOTAL  SHOULDER   SHOULDER ARTHROSCOPY     x4   TONSILLECTOMY     TUBAL LIGATION     VOCAL CORD RESECTION     There are no active problems to display for this patient.   ONSET DATE: 09/14/23  REFERRING DIAG: R CMC arthroplasty and CTR  THERAPY DIAG:  Stiffness of right hand, not elsewhere classified  Pain in right hand  Muscle weakness (generalized)  Stiffness of right wrist, not elsewhere classified  Scar tissue  Rationale for Evaluation and Treatment: Rehabilitation  SUBJECTIVE:   SUBJECTIVE STATEMENT: The therapist over at Dr. Kathyanne Parkers office gave me some exercises that I have been doing.  I wear the hard splint during day when up and about and soft splint night time - pain is good  Pt accompanied by: self  PERTINENT HISTORY: Pt had 09/14/23 R CMC arthroplasty with parallel fiber lock suspension and Weiliby tendon transfer with repair reconstruction and FCR tenotomy as well as CTR by Dr Aloha Arnold - had stitches removed and fit custom thumb spica - had been doing some AROM out of splint since then -   PRECAUTIONS: CMC arthroplasty protocol    WEIGHT BEARING RESTRICTIONS: No  PAIN:  Are you having pain? No pain  FALLS: Has patient fallen in  last 6 months? No  LIVING ENVIRONMENT: Lives with: lives with their family    PLOF: Patient increased thumb pain for a while.  Patient is retired.  Likes to do gardening and cooking and spending time with grandkids and great grandkids.  As well as paint and do crafts  PATIENT GOALS: Get my motion and strength back, right hand so that I can do things I left to do pain-free  NEXT MD VISIT: 15 November 2558  OBJECTIVE:  Note: Objective measures were completed at Evaluation unless otherwise noted.  HAND DOMINANCE: Right  ADLs:   FUNCTIONAL OUTCOME MEASURES: To be done next session  UPPER EXTREMITY ROM:     Active ROM Right eval Left eval  Shoulder flexion    Shoulder abduction    Shoulder adduction    Shoulder extension     Shoulder internal rotation    Shoulder external rotation    Elbow flexion    Elbow extension    Wrist flexion 55   Wrist extension 60   Wrist ulnar deviation 20   Wrist radial deviation 18   Wrist pronation 90   Wrist supination 90   (Blank rows = not tested)  Active ROM Right eval Left eval  Thumb MCP (0-60) 70   Thumb IP (0-80) 15   Thumb Radial abd/add (0-55) 58    Thumb Palmar abd/add (0-45) 70    Thumb Opposition to Small Finger Opposition to second digit.  Able to do opposition to 2nd and 3rd with a slight pull.    Index MCP (0-90)     Index PIP (0-100)     Index DIP (0-70)      Long MCP (0-90)      Long PIP (0-100)      Long DIP (0-70)      Ring MCP (0-90)      Ring PIP (0-100)      Ring DIP (0-70)      Little MCP (0-90)      Little PIP (0-100)      Little DIP (0-70)      (Blank rows = not tested)     HAND FUNCTION:  NT at eval - Grip strength: Right:   lbs; Left:   lbs, Lateral pinch: Right:   lbs, Left:   lbs, and 3 point pinch: Right:   lbs, Left:   lbs  COORDINATION: Impaired because of continues wearing a thumb spica.  Decreased range of motion in thumb SENSATION: Denies any sensory issues  EDEMA: Minimal over thumb thenar eminence  COGNITION: Overall cognitive status: Within functional limits for tasks assessed       TREATMENT DATE: 10/21/23                                                                                                                            Modalities: Fluidotherapy:  Time: 8 Location: Right wrist and hand Decrease pain and stiffness -performing active range of motion for wrist  and thumb and prior to review of home exercises for active range of motion    Patient to do contrast or heat 2-3 times a day followed by Thumb palmar radial abduction active range of motion 12 reps Passive range of motion to thumb IP 10 reps Blocked active range of motion to thumb IP and MCP flexion. Prior to active range of motion for  opposition to 2nd, 3rd and 4th-pain-free 5-8 reps Followed by picking up 2 cm foam block alternating all digits for opposition 5 reps pain-free Active assisted range of motion for wrist flexion extension, ulnar radial deviation as well as supination pronation 12 reps Slight pull focus on motion and active range of motion keeping pain under a 1-2/10 Follow Dr. Kathyanne Parkers protocol for wearing thumb spica during the day when up and about can take it off when sedentary. And CMC neoprene at nighttime   PATIENT EDUCATION: Education details: findings of eval and HEP  Person educated: Patient Education method: Explanation, Demonstration, Tactile cues, Verbal cues, and Handouts Education comprehension: verbalized understanding, returned demonstration, verbal cues required, and needs further education    GOALS: Goals reviewed with patient? Yes    LONG TERM GOALS: Target date: 8 wks   Patient to be independent in home program to increase R thumb AROM will within normal limits pain-free to be able to initiate strengthening Baseline: Thumb palmar abduction 70; radial abduction 58.  With some tenderness in the webspace.  Thumb IP flexion 15 and opposition to second digit slight pull to 3rd and 4th. Goal status: INITIAL  2.  Patient be independent in home program to increase R wrist active range of motion in all planes to within normal limits to initiate strengthening Baseline: Wrist extension 60 degrees and flexion 55.  Ulnar deviation 20 and radial deviation 18 Goal status: INITIAL  3.  Right wrist strength improved to 5/5 for patient to be able to push and pull door open symptom-free Baseline: 5-1/2 weeks postop only active range of motion and thumb spica on during the day with activities Goal status: INITIAL  4.  Right thumb palmar radial abduction strength improved to 5/5 for patient to be able to turn doorknob, open jars and hold glass without increase symptoms Baseline: Patient 5-1/2 weeks  postop.  Still in thumb spica during the day with activities Goal status: INITIAL  5.  Right thumb opposition improved to within normal limits for patient to be able to fasten buttons, do zips and do her medication without increase symptoms  baseline: Has been doing opposition only to second digit.  Patient with a slight pull to 3rd and 4th.  With decreased IP flexion during opposition Goal status: INITIAL  6.  Right grip and prehension strength improved to within normal limits for age for patient to be able to carry groceries, carry a plate, opening packages without increase symptoms Baseline: No strength yet patient is 5-1/2 weeks postop and mostly in thumb spica during the day with activities. Goal status: INITIAL   ASSESSMENT:  CLINICAL IMPRESSION: Patient seen today for occupational therapy evaluation for  R [facility] arthroplasty with parallel fiber lock suspension and Weiliby tendon transfer with repair reconstruction and FCR tenotomy as well as CTR by Dr Aloha Arnold on 09/14/2023.  Patient arrive with hand in thumb spica.  Per protocol patient to wear during the day with activities.  Can remove when sedentary.  And wearing CMC neoprene during the night.  Patient present with increased scar tissue and decreased range of motion in  thumb and wrist in all planes with increased discomfort and pain as well as strength limiting her functional use of right dominant hand in ADLs and IADLs.  Patient can benefit from skilled OT services to decrease pain and scar tissue and increased motion and strength to return to prior level of function.  Following [facility] arthroplasty [place]  hand protocol.  PERFORMANCE DEFICITS: in functional skills including ADLs, IADLs, ROM, strength, pain, flexibility, decreased knowledge of use of DME, and UE functional use,   and psychosocial skills including environmental adaptation and routines and behaviors.   IMPAIRMENTS: are limiting patient from ADLs, IADLs, rest and sleep, play,  leisure, and social participation.   COMORBIDITIES: has no other co-morbidities that affects occupational performance. Patient will benefit from skilled OT to address above impairments and improve overall function.  MODIFICATION OR ASSISTANCE TO COMPLETE EVALUATION: No modification of tasks or assist necessary to complete an evaluation.  OT OCCUPATIONAL PROFILE AND HISTORY: Problem focused assessment: Including review of records relating to presenting problem.  CLINICAL DECISION MAKING: LOW - limited treatment options, no task modification necessary  REHAB POTENTIAL: Good for goals  EVALUATION COMPLEXITY: Low   PLAN:  OT FREQUENCY: 1-2x/week  OT DURATION: 8 weeks  PLANNED INTERVENTIONS: 97168 OT Re-evaluation, 97535 self care/ADL training, 78295 therapeutic exercise, 97530 therapeutic activity, 97140 manual therapy, 97018 paraffin, 62130 fluidotherapy, 97034 contrast bath, 97763 Orthotic/Prosthetic subsequent, scar mobilization, passive range of motion, patient/family education, and DME and/or AE instructions    CONSULTED AND AGREED WITH PLAN OF CARE: Patient     Heloise Lobo, OTR/L,CLT 10/21/2023, 7:56 PM

## 2023-10-22 ENCOUNTER — Encounter: Payer: Self-pay | Admitting: Occupational Therapy

## 2023-10-26 ENCOUNTER — Ambulatory Visit: Admitting: Occupational Therapy

## 2023-10-26 DIAGNOSIS — Z79899 Other long term (current) drug therapy: Secondary | ICD-10-CM | POA: Diagnosis not present

## 2023-10-26 DIAGNOSIS — Z8739 Personal history of other diseases of the musculoskeletal system and connective tissue: Secondary | ICD-10-CM | POA: Diagnosis not present

## 2023-11-02 ENCOUNTER — Ambulatory Visit: Attending: Orthopedic Surgery | Admitting: Occupational Therapy

## 2023-11-02 DIAGNOSIS — M79641 Pain in right hand: Secondary | ICD-10-CM | POA: Insufficient documentation

## 2023-11-02 DIAGNOSIS — M25641 Stiffness of right hand, not elsewhere classified: Secondary | ICD-10-CM | POA: Insufficient documentation

## 2023-11-02 DIAGNOSIS — L905 Scar conditions and fibrosis of skin: Secondary | ICD-10-CM | POA: Diagnosis not present

## 2023-11-02 DIAGNOSIS — M25631 Stiffness of right wrist, not elsewhere classified: Secondary | ICD-10-CM | POA: Insufficient documentation

## 2023-11-02 NOTE — Therapy (Signed)
 OUTPATIENT OCCUPATIONAL THERAPY ORTHO TREATMENT  Patient Name: Monica Hill MRN: [id number] DOB:[birth year], 68 y.o., female Today's Date: 11/02/2023  PCP: Dr Firman Fidler REFERRING PROVIDER: DR Aloha Arnold  END OF SESSION:  OT End of Session - 11/02/23 1157     Visit Number 2    Number of Visits 12    Date for OT Re-Evaluation 12/16/23    OT Start Time 1200    Activity Tolerance Patient tolerated treatment well    Behavior During Therapy [facility] for tasks assessed/performed             Past Medical History:  Diagnosis Date   Anxiety    Arthritis    OSTEOARTHRITIS   Asthma    Bipolar disorder (HCC)    Cancer (HCC)    BREAST   Complication of anesthesia    hard to wake up after one surgery   Constipation    COPD (chronic obstructive pulmonary disease) (HCC)    Depression    Dyspnea    with exertion   Family history of adverse reaction to anesthesia    son-n/v   Fibrocystic breast disease    GERD (gastroesophageal reflux disease)    Goiter    History of colon polyps    History of hiatal hernia    small   History of methicillin resistant staphylococcus aureus (MRSA) 2017   History of shingles    Hypothyroidism    Irritable bowel disease    Kienbock's disease    Snores    Thyromegaly    Type 2 diabetes mellitus (HCC)    Urine incontinence    Past Surgical History:  Procedure Laterality Date   BREAST SURGERY Bilateral    MODIFIED RADICAL MASTECTOMY   COLONOSCOPY WITH PROPOFOL  N/A 03/23/2017   Procedure: COLONOSCOPY WITH PROPOFOL ;  Surgeon: Toledo, Alphonsus Jeans, MD;  Location: [facility];  Service: Gastroenterology;  Laterality: N/A;   ESOPHAGOGASTRODUODENOSCOPY (EGD) WITH PROPOFOL  N/A 03/23/2017   Procedure: ESOPHAGOGASTRODUODENOSCOPY (EGD) WITH PROPOFOL ;  Surgeon: Toledo, Alphonsus Jeans, MD;  Location: [facility];  Service: Gastroenterology;  Laterality: N/A;   JOINT REPLACEMENT Left 04/08/2016   TOTAL SHOULDER   SHOULDER ARTHROSCOPY     x4   TONSILLECTOMY      TUBAL LIGATION     VOCAL CORD RESECTION     There are no active problems to display for this patient.   ONSET DATE: 09/14/23  REFERRING DIAG: R CMC arthroplasty and CTR  THERAPY DIAG:  Stiffness of right hand, not elsewhere classified  Pain in right hand  Stiffness of right wrist, not elsewhere classified  Scar tissue  Rationale for Evaluation and Treatment: Rehabilitation  SUBJECTIVE:   SUBJECTIVE STATEMENT: I am doing good.  I do the heat and the exercises.  I still touch my thinking now, and the thumb feels good.  Sleep in the soft brace.  Is just 2 of the scar still feels hard and I cannot get them Pt accompanied by: self  PERTINENT HISTORY: Pt had 09/14/23 R CMC arthroplasty with parallel fiber lock suspension and Weiliby tendon transfer with repair reconstruction and FCR tenotomy as well as CTR by Dr Aloha Arnold - had stitches removed and fit custom thumb spica - had been doing some AROM out of splint since then -   PRECAUTIONS: CMC arthroplasty protocol    WEIGHT BEARING RESTRICTIONS: No  PAIN:  Are you having pain? No pain  FALLS: Has patient fallen in last 6 months? No  LIVING ENVIRONMENT: Lives with: lives with their family  PLOF: Patient increased thumb pain for a while.  Patient is retired.  Likes to do gardening and cooking and spending time with grandkids and great grandkids.  As well as paint and do crafts  PATIENT GOALS: Get my motion and strength back, right hand so that I can do things I left to do pain-free  NEXT MD VISIT: 15 November 2558  OBJECTIVE:  Note: Objective measures were completed at Evaluation unless otherwise noted.  HAND DOMINANCE: Right  ADLs:   FUNCTIONAL OUTCOME MEASURES: To be done next session  UPPER EXTREMITY ROM:     Active ROM Right eval Left eval R 11/02/23  Shoulder flexion     Shoulder abduction     Shoulder adduction     Shoulder extension     Shoulder internal rotation     Shoulder external rotation      Elbow flexion     Elbow extension     Wrist flexion 55  70  Wrist extension 60  60  Wrist ulnar deviation 20  25  Wrist radial deviation 18  20  Wrist pronation 90  90  Wrist supination 90  90  (Blank rows = not tested)  Active ROM Right eval Left eval R 11/02/23  Thumb MCP (0-60) 70 65 65  Thumb IP (0-80) 15 50 50  Thumb Radial abd/add (0-55) 58   55  Thumb Palmar abd/add (0-45) 70   70  Thumb Opposition to Small Finger Opposition to second digit.  Able to do opposition to 2nd and 3rd with a slight pull.   Opposition to fifth pain free  Index MCP (0-90)      Index PIP (0-100)      Index DIP (0-70)       Long MCP (0-90)       Long PIP (0-100)       Long DIP (0-70)       Ring MCP (0-90)       Ring PIP (0-100)       Ring DIP (0-70)       Little MCP (0-90)       Little PIP (0-100)       Little DIP (0-70)       (Blank rows = not tested)     HAND FUNCTION:  NT at eval - Grip strength: Right:   lbs; Left:   lbs, Lateral pinch: Right:   lbs, Left:   lbs, and 3 point pinch: Right:   lbs, Left:   lbs  COORDINATION: Impaired because of continues wearing a thumb spica.  Decreased range of motion in thumb SENSATION: Denies any sensory issues  EDEMA: Minimal over thumb thenar eminence  COGNITION: Overall cognitive status: Within functional limits for tasks assessed       TREATMENT DATE: 11/02/23  Measurements taken for right thumb and wrist in all planes.  Great progress.  See flowsheet. Continued to be pain-free. Patient continue with to have scar adhesions or thickness on forearm as well as thumb.  Modalities: Fluidotherapy:  Time: 8 Location: Right wrist and hand Decrease pain and stiffness -performing active range of motion for wrist and thumb and prior to manual therapy and range of motion   Manual therapy done by OT focusing on all scars  mini massager with great success on forearm scars as well as base of thumb. Also to carpal tunnel scar with carpal tunnel sprites and metacarpal sprites. Patient to continue with scar mobilization Provided with new Cica -Care scar pad at nighttime.  Patient to continue with contrast or heat 2-3 times a day followed by Thumb palmar radial abduction active range of motion 12 reps Passive range of motion to thumb IP 10 reps Blocked active range of motion to thumb IP and MCP flexion. AROM opposition to all digits.   Patient can initiate sliding down 4th and 5th if good stability CMC the patient has in session today.  And pain-free.   8-10 reps.    Active assisted range of motion for wrist flexion open hand and composite fist as well as extension over the armrest, 3 x 30 seconds each AAROM ulnar radial deviation 12 reps Continue with AROM supination pronation 12 reps Slight pull focus on motion and active range of motion keeping pain under a 1-2/10 Follow Dr. Kathyanne Parkers protocol for wearing thumb spica during the day when up and about can take it off when sedentary. And CMC neoprene at nighttime   PATIENT EDUCATION: Education details: findings of eval and HEP  Person educated: Patient Education method: Explanation, Demonstration, Tactile cues, Verbal cues, and Handouts Education comprehension: verbalized understanding, returned demonstration, verbal cues required, and needs further education    GOALS: Goals reviewed with patient? Yes    LONG TERM GOALS: Target date: 8 wks   Patient to be independent in home program to increase R thumb AROM will within normal limits pain-free to be able to initiate strengthening Baseline: Thumb palmar abduction 70; radial abduction 58.  With some tenderness in the webspace.  Thumb IP flexion 15 and opposition to second digit slight pull to 3rd and 4th. Goal status: INITIAL  2.  Patient be independent in home program to increase R wrist active range of  motion in all planes to within normal limits to initiate strengthening Baseline: Wrist extension 60 degrees and flexion 55.  Ulnar deviation 20 and radial deviation 18 Goal status: INITIAL  3.  Right wrist strength improved to 5/5 for patient to be able to push and pull door open symptom-free Baseline: 5-1/2 weeks postop only active range of motion and thumb spica on during the day with activities Goal status: INITIAL  4.  Right thumb palmar radial abduction strength improved to 5/5 for patient to be able to turn doorknob, open jars and hold glass without increase symptoms Baseline: Patient 5-1/2 weeks postop.  Still in thumb spica during the day with activities Goal status: INITIAL  5.  Right thumb opposition improved to within normal limits for patient to be able to fasten buttons, do zips and do her medication without increase symptoms  baseline: Has been doing opposition only to second digit.  Patient with a slight pull to 3rd and 4th.  With decreased IP flexion during opposition Goal status: INITIAL  6.  Right grip and prehension strength improved to within normal  limits for age for patient to be able to carry groceries, carry a plate, opening packages without increase symptoms Baseline: No strength yet patient is 5-1/2 weeks postop and mostly in thumb spica during the day with activities. Goal status: INITIAL   ASSESSMENT:  CLINICAL IMPRESSION: Patient seen today for occupational therapy for  R [facility] arthroplasty with parallel fiber lock suspension and Weiliby tendon transfer with repair reconstruction and FCR tenotomy as well as CTR by Dr Aloha Arnold on 09/14/2023.  Patient arrive with hand in thumb spica.  Per protocol patient to wear during the day with activities.  Can remove when sedentary.  And wearing CMC neoprene during the night.  Patient present with increased scar tissue and decreased range of motion in thumb and wrist in all planes with increased discomfort and pain as well as  strength limiting her functional use of right dominant hand in ADLs and IADLs.  NOW patient arrives with great progress in thumb active range of motion in all planes with less pain.  As well as increased wrist motion.  Focusing on scar mobilization today as well as upgrade thumb opposition as well as wrist flexion extension stretches.  Patient tolerating well.  Continue with protocol.  Patient can benefit from skilled OT services to decrease pain and scar tissue and increased motion and strength to return to prior level of function.  Following [facility] arthroplasty [place]  hand protocol.  PERFORMANCE DEFICITS: in functional skills including ADLs, IADLs, ROM, strength, pain, flexibility, decreased knowledge of use of DME, and UE functional use,   and psychosocial skills including environmental adaptation and routines and behaviors.   IMPAIRMENTS: are limiting patient from ADLs, IADLs, rest and sleep, play, leisure, and social participation.   COMORBIDITIES: has no other co-morbidities that affects occupational performance. Patient will benefit from skilled OT to address above impairments and improve overall function.  MODIFICATION OR ASSISTANCE TO COMPLETE EVALUATION: No modification of tasks or assist necessary to complete an evaluation.  OT OCCUPATIONAL PROFILE AND HISTORY: Problem focused assessment: Including review of records relating to presenting problem.  CLINICAL DECISION MAKING: LOW - limited treatment options, no task modification necessary  REHAB POTENTIAL: Good for goals  EVALUATION COMPLEXITY: Low   PLAN:  OT FREQUENCY: 1-2x/week  OT DURATION: 8 weeks  PLANNED INTERVENTIONS: 97168 OT Re-evaluation, 97535 self care/ADL training, 72536 therapeutic exercise, 97530 therapeutic activity, 97140 manual therapy, 97018 paraffin, 64403 fluidotherapy, 97034 contrast bath, 97763 Orthotic/Prosthetic subsequent, scar mobilization, passive range of motion, patient/family education, and DME and/or  AE instructions    CONSULTED AND AGREED WITH PLAN OF CARE: Patient     Heloise Lobo, OTR/L,CLT 11/02/2023, 11:58 AM

## 2023-11-06 DIAGNOSIS — L308 Other specified dermatitis: Secondary | ICD-10-CM | POA: Diagnosis not present

## 2023-11-06 DIAGNOSIS — B028 Zoster with other complications: Secondary | ICD-10-CM | POA: Diagnosis not present

## 2023-11-09 DIAGNOSIS — B028 Zoster with other complications: Secondary | ICD-10-CM | POA: Diagnosis not present

## 2023-11-11 ENCOUNTER — Ambulatory Visit: Admitting: Occupational Therapy

## 2023-11-15 ENCOUNTER — Encounter: Admitting: Occupational Therapy

## 2023-11-15 ENCOUNTER — Encounter: Payer: Self-pay | Admitting: Occupational Therapy

## 2023-11-15 DIAGNOSIS — Z4789 Encounter for other orthopedic aftercare: Secondary | ICD-10-CM | POA: Diagnosis not present

## 2023-11-16 ENCOUNTER — Encounter: Admitting: Occupational Therapy

## 2023-11-16 ENCOUNTER — Ambulatory Visit: Admitting: Occupational Therapy

## 2023-11-16 DIAGNOSIS — E785 Hyperlipidemia, unspecified: Secondary | ICD-10-CM | POA: Diagnosis not present

## 2023-11-16 DIAGNOSIS — E1169 Type 2 diabetes mellitus with other specified complication: Secondary | ICD-10-CM | POA: Diagnosis not present

## 2023-11-16 DIAGNOSIS — B028 Zoster with other complications: Secondary | ICD-10-CM | POA: Diagnosis not present

## 2023-11-18 ENCOUNTER — Encounter: Admitting: Occupational Therapy

## 2023-11-22 ENCOUNTER — Encounter: Admitting: Occupational Therapy

## 2023-11-25 ENCOUNTER — Encounter: Admitting: Occupational Therapy

## 2023-11-29 ENCOUNTER — Encounter: Admitting: Occupational Therapy

## 2023-11-30 DIAGNOSIS — E0590 Thyrotoxicosis, unspecified without thyrotoxic crisis or storm: Secondary | ICD-10-CM | POA: Diagnosis not present

## 2023-12-02 ENCOUNTER — Encounter: Admitting: Occupational Therapy

## 2023-12-07 ENCOUNTER — Encounter: Admitting: Occupational Therapy

## 2023-12-07 DIAGNOSIS — E0590 Thyrotoxicosis, unspecified without thyrotoxic crisis or storm: Secondary | ICD-10-CM | POA: Diagnosis not present

## 2023-12-07 DIAGNOSIS — M816 Localized osteoporosis [Lequesne]: Secondary | ICD-10-CM | POA: Diagnosis not present

## 2023-12-07 DIAGNOSIS — B028 Zoster with other complications: Secondary | ICD-10-CM | POA: Diagnosis not present

## 2023-12-07 DIAGNOSIS — E785 Hyperlipidemia, unspecified: Secondary | ICD-10-CM | POA: Diagnosis not present

## 2023-12-07 DIAGNOSIS — E1169 Type 2 diabetes mellitus with other specified complication: Secondary | ICD-10-CM | POA: Diagnosis not present

## 2023-12-08 DIAGNOSIS — G894 Chronic pain syndrome: Secondary | ICD-10-CM | POA: Diagnosis not present

## 2023-12-08 DIAGNOSIS — M545 Low back pain, unspecified: Secondary | ICD-10-CM | POA: Diagnosis not present

## 2023-12-08 DIAGNOSIS — M25512 Pain in left shoulder: Secondary | ICD-10-CM | POA: Diagnosis not present

## 2023-12-08 DIAGNOSIS — Z79899 Other long term (current) drug therapy: Secondary | ICD-10-CM | POA: Diagnosis not present

## 2023-12-10 ENCOUNTER — Encounter: Admitting: Occupational Therapy

## 2023-12-13 ENCOUNTER — Encounter: Admitting: Occupational Therapy

## 2023-12-17 ENCOUNTER — Encounter: Admitting: Occupational Therapy

## 2023-12-20 ENCOUNTER — Encounter: Admitting: Occupational Therapy

## 2023-12-23 ENCOUNTER — Encounter: Admitting: Occupational Therapy

## 2023-12-27 ENCOUNTER — Encounter: Admitting: Occupational Therapy

## 2023-12-27 DIAGNOSIS — Z4789 Encounter for other orthopedic aftercare: Secondary | ICD-10-CM | POA: Diagnosis not present

## 2023-12-30 ENCOUNTER — Encounter: Admitting: Occupational Therapy

## 2024-01-12 DIAGNOSIS — Z79899 Other long term (current) drug therapy: Secondary | ICD-10-CM | POA: Diagnosis not present

## 2024-01-12 DIAGNOSIS — E1169 Type 2 diabetes mellitus with other specified complication: Secondary | ICD-10-CM | POA: Diagnosis not present

## 2024-01-12 DIAGNOSIS — E785 Hyperlipidemia, unspecified: Secondary | ICD-10-CM | POA: Diagnosis not present

## 2024-01-19 DIAGNOSIS — F319 Bipolar disorder, unspecified: Secondary | ICD-10-CM | POA: Diagnosis not present

## 2024-01-19 DIAGNOSIS — C50011 Malignant neoplasm of nipple and areola, right female breast: Secondary | ICD-10-CM | POA: Diagnosis not present

## 2024-01-19 DIAGNOSIS — E785 Hyperlipidemia, unspecified: Secondary | ICD-10-CM | POA: Diagnosis not present

## 2024-01-19 DIAGNOSIS — E538 Deficiency of other specified B group vitamins: Secondary | ICD-10-CM | POA: Diagnosis not present

## 2024-01-19 DIAGNOSIS — E1169 Type 2 diabetes mellitus with other specified complication: Secondary | ICD-10-CM | POA: Diagnosis not present

## 2024-01-19 DIAGNOSIS — Z0000 Encounter for general adult medical examination without abnormal findings: Secondary | ICD-10-CM | POA: Diagnosis not present

## 2024-01-19 DIAGNOSIS — E782 Mixed hyperlipidemia: Secondary | ICD-10-CM | POA: Diagnosis not present

## 2024-01-19 DIAGNOSIS — Z1331 Encounter for screening for depression: Secondary | ICD-10-CM | POA: Diagnosis not present

## 2024-01-19 DIAGNOSIS — Z79899 Other long term (current) drug therapy: Secondary | ICD-10-CM | POA: Diagnosis not present

## 2024-02-08 ENCOUNTER — Ambulatory Visit: Attending: Orthopedic Surgery | Admitting: Occupational Therapy

## 2024-02-08 DIAGNOSIS — M79641 Pain in right hand: Secondary | ICD-10-CM | POA: Diagnosis not present

## 2024-02-08 DIAGNOSIS — M25631 Stiffness of right wrist, not elsewhere classified: Secondary | ICD-10-CM | POA: Insufficient documentation

## 2024-02-08 DIAGNOSIS — M25641 Stiffness of right hand, not elsewhere classified: Secondary | ICD-10-CM | POA: Diagnosis not present

## 2024-02-08 NOTE — Therapy (Signed)
 OUTPATIENT OCCUPATIONAL THERAPY ORTHO TREATMENTRECERT  Patient Name: Monica Hill MRN: [id number] DOB:January 11, 1956, 68 y.o., female Today's Date: 02/08/2024  PCP: Dr Oneil Pinal REFERRING PROVIDER: DR Camella  END OF SESSION:  OT End of Session - 02/08/24 1355     Visit Number 3    Number of Visits 4    Date for OT Re-Evaluation 02/29/24    OT Start Time 1315    OT Stop Time 1355    OT Time Calculation (min) 40 min    Activity Tolerance Patient tolerated treatment well    Behavior During Therapy WFL for tasks assessed/performed          Past Medical History:  Diagnosis Date   Anxiety    Arthritis    OSTEOARTHRITIS   Asthma    Bipolar disorder (HCC)    Cancer (HCC)    BREAST   Complication of anesthesia    hard to wake up after one surgery   Constipation    COPD (chronic obstructive pulmonary disease) (HCC)    Depression    Dyspnea    with exertion   Family history of adverse reaction to anesthesia    son-n/v   Fibrocystic breast disease    GERD (gastroesophageal reflux disease)    Goiter    History of colon polyps    History of hiatal hernia    small   History of methicillin resistant staphylococcus aureus (MRSA) 2017   History of shingles    Hypothyroidism    Irritable bowel disease    Kienbock's disease    Snores    Thyromegaly    Type 2 diabetes mellitus (HCC)    Urine incontinence    Past Surgical History:  Procedure Laterality Date   BREAST SURGERY Bilateral    MODIFIED RADICAL MASTECTOMY   COLONOSCOPY WITH PROPOFOL  N/A 03/23/2017   Procedure: COLONOSCOPY WITH PROPOFOL ;  Surgeon: Toledo, Ladell POUR, MD;  Location: [facility];  Service: Gastroenterology;  Laterality: N/A;   ESOPHAGOGASTRODUODENOSCOPY (EGD) WITH PROPOFOL  N/A 03/23/2017   Procedure: ESOPHAGOGASTRODUODENOSCOPY (EGD) WITH PROPOFOL ;  Surgeon: Toledo, Ladell POUR, MD;  Location: [facility];  Service: Gastroenterology;  Laterality: N/A;   JOINT REPLACEMENT Left 04/08/2016   TOTAL  SHOULDER   SHOULDER ARTHROSCOPY     x4   TONSILLECTOMY     TUBAL LIGATION     VOCAL CORD RESECTION     There are no active problems to display for this patient.   ONSET DATE: 09/14/23  REFERRING DIAG: R CMC arthroplasty and CTR  THERAPY DIAG:  Stiffness of right hand, not elsewhere classified  Pain in right hand  Stiffness of right wrist, not elsewhere classified  Rationale for Evaluation and Treatment: Rehabilitation  SUBJECTIVE:   SUBJECTIVE STATEMENT: I wanted to come and see you for a couple of sessions.  About 3 to 4 weeks ago my fingers started cramping up.  And I have trouble with fine motor.  Especially my index and middle finger.  When I try and pick up small objects Pt accompanied by: self  PERTINENT HISTORY: Pt had 09/14/23 R CMC arthroplasty with parallel fiber lock suspension and Weiliby tendon transfer with repair reconstruction and FCR tenotomy as well as CTR by Dr Camella - had stitches removed and fit custom thumb spica - had been doing some AROM out of splint since then -   PRECAUTIONS: CMC arthroplasty protocol    WEIGHT BEARING RESTRICTIONS: No  PAIN:  Are you having pain? No pain  FALLS: Has patient fallen in  last 6 months? No  LIVING ENVIRONMENT: Lives with: lives with their family    PLOF: Patient increased thumb pain for a while.  Patient is retired.  Likes to do gardening and cooking and spending time with grandkids and great grandkids.  As well as paint and do crafts  PATIENT GOALS: Get my motion and strength back, right hand so that I can do things I left to do pain-free  NEXT MD VISIT: 15 November 2558  OBJECTIVE:  Note: Objective measures were completed at Evaluation unless otherwise noted.  HAND DOMINANCE: Right  ADLs:   FUNCTIONAL OUTCOME MEASURES: To be done next session  UPPER EXTREMITY ROM:     Active ROM Right eval Left eval R 11/02/23  Shoulder flexion     Shoulder abduction     Shoulder adduction     Shoulder  extension     Shoulder internal rotation     Shoulder external rotation     Elbow flexion     Elbow extension     Wrist flexion 55  70  Wrist extension 60  60  Wrist ulnar deviation 20  25  Wrist radial deviation 18  20  Wrist pronation 90  90  Wrist supination 90  90  (Blank rows = not tested)  Active ROM Right eval Left eval R 11/02/23  Thumb MCP (0-60) 70 65 65  Thumb IP (0-80) 15 50 50  Thumb Radial abd/add (0-55) 58   55  Thumb Palmar abd/add (0-45) 70   70  Thumb Opposition to Small Finger Opposition to second digit.  Able to do opposition to 2nd and 3rd with a slight pull.   Opposition to fifth pain free  Index MCP (0-90)      Index PIP (0-100)      Index DIP (0-70)       Long MCP (0-90)       Long PIP (0-100)       Long DIP (0-70)       Ring MCP (0-90)       Ring PIP (0-100)       Ring DIP (0-70)       Little MCP (0-90)       Little PIP (0-100)       Little DIP (0-70)       (Blank rows = not tested)     HAND FUNCTION:  02/08/24  Grip strength: Right: 55 lbs; Left: 55 lbs, Lateral pinch: Right: 10 lbs, Left: 16 lbs, and 3 point pinch: Right: 10 lbs, Left: 12 lbs  COORDINATION: At eval impaired because of continues wearing a thumb spica.  Decreased range of motion in thumb SENSATION: Denies any sensory issues  EDEMA: Minimal over thumb thenar eminence  COGNITION: Overall cognitive status: Within functional limits for tasks assessed       TREATMENT DATE: 02/08/24  Patient returns after not being seen for about 2 to 3 months.  Post CMC arthroplasty on the right.  Patient with increased reports of cramping and stiffness in digits with fine motor. Patient's wrist flexion extension within normal limits. Grip and prehension strength increased greatly.  Still decreased compared to the left.  Patient is right-hand dominant but patient difficulty with  dexterity and fine motor. Decreased individual tendon glides Decrease opposition with objects from palm to fingers fingers to palm. Decreased fine motor between radial ulnar side of the hand manipulating objects As well as decreased fine motor picking up objects to palm and retrieving palm to fingertips. Patient is right-hand 12.27 seconds with 5 small objects fingertips to palm to  fingertips and left hand 9 seconds  Done paraffin for moist deep heat to decrease stiffness decrease scar tissue and pain.  8 minutes followed by metacarpals mobilization as well as static dry 5 cm cupping 1 in the forearm 1 in the palm with wrist extension stretches and digit extension stretches Patient to do moist heat 2 times a day followed by soft tissue mobilization and massage to the palm and fingertips Followed by individual tendon glides 10 reps And then golf ball from palm to fingertips fingertips to palm 1 minute Then golf ball from palm opposition to 2nd and 3rd to back to palm and palm to 4th and 5th digit and palm 1 minute can repeat twice Followed by 1 cm objects 5 picking up 1 at a time to palm retrieving 1 at a time out of the palm to fingertips 1 minute to 2 minutes.  PATIENT EDUCATION: Education details: findings of eval and HEP  Person educated: Patient Education method: Explanation, Demonstration, Tactile cues, Verbal cues, and Handouts Education comprehension: verbalized understanding, returned demonstration, verbal cues required, and needs further education    GOALS: Goals reviewed with patient? Yes    LONG TERM GOALS: Target date: 8 wks   Patient to be independent in home program to increase R thumb AROM will within normal limits pain-free to be able to initiate strengthening Baseline: Thumb palmar abduction 70; radial abduction 58.  With some tenderness in the webspace.  Thumb IP flexion 15 and opposition to second digit slight pull to 3rd and 4th. Goal status: Met  2.  Patient be  independent in home program to increase R wrist active range of motion in all planes to within normal limits to initiate strengthening Baseline: Wrist extension 60 degrees and flexion 55.  Ulnar deviation 20 and radial deviation 18 Goal status: Met  3.  Right wrist strength improved to 5/5 for patient to be able to push and pull door open symptom-free Baseline: 5-1/2 weeks postop only active range of motion and thumb spica on during the day with activities Goal status: Met  4.  Right thumb palmar radial abduction strength improved to 5/5 for patient to be able to turn doorknob, open jars and hold glass without increase symptoms Baseline: Patient 5-1/2 weeks postop.  Still in thumb spica during the day with activities Goal status: Met  5.  Right thumb opposition improved to within normal limits for patient to be able to fasten buttons, do zips and do her medication without increase symptoms  baseline: Has been doing opposition only to second digit.  Patient with a slight pull to 3rd and 4th.  With decreased IP flexion during opposition Goal status: Met  6.  Right grip and prehension strength improved to within normal limits for age for patient  to be able to carry groceries, carry a plate, opening packages without increase symptoms Baseline: No strength yet patient is 5-1/2 weeks postop and mostly in thumb spica during the day with activities. Goal status: Met  7.  Patient's right hand dexterity and fine motor manipulating objects from palm to fingertips fingertips to palm increase with 1 to 2 seconds with less tightness and discomfort BASELINE: 5 objects or right hand 12.27 seconds and left 9 seconds Goal status  New   ASSESSMENT:  CLINICAL IMPRESSION: Patient was seen for occupational therapy for  R [facility] arthroplasty and  CTR by Dr Camella on 09/14/2023.  Patient did great progress in grip and prehension strength as well as wrist motion and thumb strength.  Patient returns today after not  being seen for 2 to 3 months with reports of increased spasm and stiffness tightness in the fingertips especially with fine motor coordination.  Upon assessment patient doing really well with prehension strength and grip.  But individual tendon glides.  As well as fine motor coordination with manipulating objects from palm to fingertips and fingertips to palm as well as thumb on fingers and fingers and thumb.  Patient 12.27 seconds on 5 small objects manipulation in the right hand and 9 seconds on the left.  Patient was provided with a home program and can follow-up 1 more time with me in 2 weeks.    Patient can benefit from skilled OT services to decrease stiffness and tightness and increase fine motor and dexterity return to prior level of function.    PERFORMANCE DEFICITS: in functional skills including ADLs, IADLs, ROM, strength, pain, flexibility, decreased knowledge of use of DME, and UE functional use,   and psychosocial skills including environmental adaptation and routines and behaviors.   IMPAIRMENTS: are limiting patient from ADLs, IADLs, rest and sleep, play, leisure, and social participation.   COMORBIDITIES: has no other co-morbidities that affects occupational performance. Patient will benefit from skilled OT to address above impairments and improve overall function.  MODIFICATION OR ASSISTANCE TO COMPLETE EVALUATION: No modification of tasks or assist necessary to complete an evaluation.  OT OCCUPATIONAL PROFILE AND HISTORY: Problem focused assessment: Including review of records relating to presenting problem.  CLINICAL DECISION MAKING: LOW - limited treatment options, no task modification necessary  REHAB POTENTIAL: Good for goals  EVALUATION COMPLEXITY: Low   PLAN:  OT FREQUENCY: 2 visits  OT DURATION: 3 weeks  PLANNED INTERVENTIONS: 97168 OT Re-evaluation, 97535 self care/ADL training, 02889 therapeutic exercise, 97530 therapeutic activity, 97140 manual therapy, 97018  paraffin, 02960 fluidotherapy, 97034 contrast bath, 97763 Orthotic/Prosthetic subsequent, scar mobilization, passive range of motion, patient/family education, and DME and/or AE instructions    CONSULTED AND AGREED WITH PLAN OF CARE: Patient     Ancel Peters, OTR/L,CLT 02/08/2024, 2:07 PM

## 2024-02-14 DIAGNOSIS — N3090 Cystitis, unspecified without hematuria: Secondary | ICD-10-CM | POA: Diagnosis not present

## 2024-02-14 DIAGNOSIS — N898 Other specified noninflammatory disorders of vagina: Secondary | ICD-10-CM | POA: Diagnosis not present

## 2024-02-28 ENCOUNTER — Ambulatory Visit: Admitting: Occupational Therapy

## 2024-03-02 ENCOUNTER — Ambulatory Visit: Attending: Orthopedic Surgery | Admitting: Occupational Therapy

## 2024-03-02 DIAGNOSIS — M25641 Stiffness of right hand, not elsewhere classified: Secondary | ICD-10-CM | POA: Insufficient documentation

## 2024-03-02 DIAGNOSIS — Z79899 Other long term (current) drug therapy: Secondary | ICD-10-CM | POA: Diagnosis not present

## 2024-03-02 DIAGNOSIS — M25631 Stiffness of right wrist, not elsewhere classified: Secondary | ICD-10-CM | POA: Insufficient documentation

## 2024-03-02 DIAGNOSIS — L905 Scar conditions and fibrosis of skin: Secondary | ICD-10-CM | POA: Diagnosis not present

## 2024-03-02 DIAGNOSIS — M6281 Muscle weakness (generalized): Secondary | ICD-10-CM | POA: Insufficient documentation

## 2024-03-02 DIAGNOSIS — M79641 Pain in right hand: Secondary | ICD-10-CM | POA: Insufficient documentation

## 2024-03-02 DIAGNOSIS — Z8739 Personal history of other diseases of the musculoskeletal system and connective tissue: Secondary | ICD-10-CM | POA: Diagnosis not present

## 2024-03-04 NOTE — Therapy (Signed)
 OUTPATIENT OCCUPATIONAL THERAPY [facility]  Patient Name: Monica Hill MRN: [id number] DOB:05-12-1956, 68 y.o., female Today's Date: 03/04/2024  PCP: Dr Oneil Pinal REFERRING PROVIDER: DR Camella  END OF SESSION:  OT End of Session - 03/04/24 2125     Visit Number 4    Number of Visits 8    Date for Recertification  03/30/24    OT Start Time 1115    OT Stop Time 1200    OT Time Calculation (min) 45 min    Activity Tolerance Patient tolerated treatment well    Behavior During Therapy WFL for tasks assessed/performed          Past Medical History:  Diagnosis Date   Anxiety    Arthritis    OSTEOARTHRITIS   Asthma    Bipolar disorder (HCC)    Cancer (HCC)    BREAST   Complication of anesthesia    hard to wake up after one surgery   Constipation    COPD (chronic obstructive pulmonary disease) (HCC)    Depression    Dyspnea    with exertion   Family history of adverse reaction to anesthesia    son-n/v   Fibrocystic breast disease    GERD (gastroesophageal reflux disease)    Goiter    History of colon polyps    History of hiatal hernia    small   History of methicillin resistant staphylococcus aureus (MRSA) 2017   History of shingles    Hypothyroidism    Irritable bowel disease    Kienbock's disease    Snores    Thyromegaly    Type 2 diabetes mellitus (HCC)    Urine incontinence    Past Surgical History:  Procedure Laterality Date   BREAST SURGERY Bilateral    MODIFIED RADICAL MASTECTOMY   COLONOSCOPY WITH PROPOFOL  N/A 03/23/2017   Procedure: COLONOSCOPY WITH PROPOFOL ;  Surgeon: Toledo, Ladell POUR, MD;  Location: [facility];  Service: Gastroenterology;  Laterality: N/A;   ESOPHAGOGASTRODUODENOSCOPY (EGD) WITH PROPOFOL  N/A 03/23/2017   Procedure: ESOPHAGOGASTRODUODENOSCOPY (EGD) WITH PROPOFOL ;  Surgeon: Toledo, Ladell POUR, MD;  Location: [facility];  Service: Gastroenterology;  Laterality: N/A;   JOINT REPLACEMENT Left 04/08/2016   TOTAL  SHOULDER   SHOULDER ARTHROSCOPY     x4   TONSILLECTOMY     TUBAL LIGATION     VOCAL CORD RESECTION     There are no active problems to display for this patient.   ONSET DATE: 09/14/23  REFERRING DIAG: R CMC arthroplasty and CTR  THERAPY DIAG:  Stiffness of right hand, not elsewhere classified  Pain in right hand  Stiffness of right wrist, not elsewhere classified  Scar tissue  Muscle weakness (generalized)  Rationale for Evaluation and Treatment: Rehabilitation  SUBJECTIVE:   SUBJECTIVE STATEMENT: I wanted to come and see you for a couple of sessions.  About 3 to 4 weeks ago my fingers started cramping up.  And I have trouble with fine motor.  Especially my index and middle finger.  When I try and pick up small objects Pt accompanied by: self  PERTINENT HISTORY: Pt had 09/14/23 R CMC arthroplasty with parallel fiber lock suspension and Weiliby tendon transfer with repair reconstruction and FCR tenotomy as well as CTR by Dr Camella - had stitches removed and fit custom thumb spica - had been doing some AROM out of splint since then -   PRECAUTIONS: CMC arthroplasty protocol    WEIGHT BEARING RESTRICTIONS: No  PAIN:  Are you having pain? No  pain  FALLS: Has patient fallen in last 6 months? No  LIVING ENVIRONMENT: Lives with: lives with their family    PLOF: Patient increased thumb pain for a while.  Patient is retired.  Likes to do gardening and cooking and spending time with grandkids and great grandkids.  As well as paint and do crafts  PATIENT GOALS: Get my motion and strength back, right hand so that I can do things I left to do pain-free  NEXT MD VISIT: 15 November 2558  OBJECTIVE:  Note: Objective measures were completed at Evaluation unless otherwise noted.  HAND DOMINANCE: Right  ADLs:   FUNCTIONAL OUTCOME MEASURES: To be done next session  UPPER EXTREMITY ROM:     Active ROM Right eval Left eval R 11/02/23  Shoulder flexion     Shoulder  abduction     Shoulder adduction     Shoulder extension     Shoulder internal rotation     Shoulder external rotation     Elbow flexion     Elbow extension     Wrist flexion 55  70  Wrist extension 60  60  Wrist ulnar deviation 20  25  Wrist radial deviation 18  20  Wrist pronation 90  90  Wrist supination 90  90  (Blank rows = not tested)  Active ROM Right eval Left eval R 11/02/23  Thumb MCP (0-60) 70 65 65  Thumb IP (0-80) 15 50 50  Thumb Radial abd/add (0-55) 58   55  Thumb Palmar abd/add (0-45) 70   70  Thumb Opposition to Small Finger Opposition to second digit.  Able to do opposition to 2nd and 3rd with a slight pull.   Opposition to fifth pain free  Index MCP (0-90)      Index PIP (0-100)      Index DIP (0-70)       Long MCP (0-90)       Long PIP (0-100)       Long DIP (0-70)       Ring MCP (0-90)       Ring PIP (0-100)       Ring DIP (0-70)       Little MCP (0-90)       Little PIP (0-100)       Little DIP (0-70)       (Blank rows = not tested)     HAND FUNCTION:  02/08/24  Grip strength: Right: 55 lbs; Left: 55 lbs, Lateral pinch: Right: 10 lbs, Left: 16 lbs, and 3 point pinch: Right: 10 lbs, Left: 12 lbs  COORDINATION: At eval impaired because of continues wearing a thumb spica.  Decreased range of motion in thumb SENSATION: Denies any sensory issues  EDEMA: Minimal over thumb thenar eminence  COGNITION: Overall cognitive status: Within functional limits for tasks assessed       TREATMENT DATE: 03/02/24  NOTE in PROGRESS, more details to follow.  Post CMC arthroplasty on the right.  Patient with increased reports of cramping and stiffness in digits with fine motor. Patient's wrist flexion extension within normal limits. Grip and prehension strength increased greatly.  Still decreased compared to the left.  Patient is right-hand dominant  but patient difficulty with dexterity and fine motor. Decreased individual tendon glides Decrease opposition with objects from palm to fingers fingers to palm. Decreased fine motor between radial ulnar side of the hand manipulating objects As well as decreased fine motor picking up objects to palm and retrieving palm to fingertips. Patient is right-hand 12.27 seconds with 5 small objects fingertips to palm to  fingertips and left hand 9 seconds  Done paraffin for moist deep heat to decrease stiffness decrease scar tissue and pain.  8 minutes followed by metacarpals mobilization as well as static dry 5 cm cupping 1 in the forearm 1 in the palm with wrist extension stretches and digit extension stretches Patient to do moist heat 2 times a day followed by soft tissue mobilization and massage to the palm and fingertips Followed by individual tendon glides 10 reps And then golf ball from palm to fingertips fingertips to palm 1 minute Then golf ball from palm opposition to 2nd and 3rd to back to palm and palm to 4th and 5th digit and palm 1 minute can repeat twice Followed by 1 cm objects 5 picking up 1 at a time to palm retrieving 1 at a time out of the palm to fingertips 1 minute to 2 minutes.  PATIENT EDUCATION: Education details: findings of eval and HEP  Person educated: Patient Education method: Explanation, Demonstration, Tactile cues, Verbal cues, and Handouts Education comprehension: verbalized understanding, returned demonstration, verbal cues required, and needs further education    GOALS: Goals reviewed with patient? Yes    LONG TERM GOALS: Target date: 8 wks   Patient to be independent in home program to increase R thumb AROM will within normal limits pain-free to be able to initiate strengthening Baseline: Thumb palmar abduction 70; radial abduction 58.  With some tenderness in the webspace.  Thumb IP flexion 15 and opposition to second digit slight pull to 3rd and 4th. Goal  status: Met  2.  Patient be independent in home program to increase R wrist active range of motion in all planes to within normal limits to initiate strengthening Baseline: Wrist extension 60 degrees and flexion 55.  Ulnar deviation 20 and radial deviation 18 Goal status: Met  3.  Right wrist strength improved to 5/5 for patient to be able to push and pull door open symptom-free Baseline: 5-1/2 weeks postop only active range of motion and thumb spica on during the day with activities Goal status: Met  4.  Right thumb palmar radial abduction strength improved to 5/5 for patient to be able to turn doorknob, open jars and hold glass without increase symptoms Baseline: Patient 5-1/2 weeks postop.  Still in thumb spica during the day with activities Goal status: Met  5.  Right thumb opposition improved to within normal limits for patient to be able to fasten buttons, do zips and do her medication without increase symptoms  baseline: Has been doing opposition only to second digit.  Patient with a slight pull to 3rd and 4th.  With decreased IP flexion during opposition Goal status: Met  6.  Right grip and prehension strength improved to within normal limits for age for patient to be able to carry  groceries, carry a plate, opening packages without increase symptoms Baseline: No strength yet patient is 5-1/2 weeks postop and mostly in thumb spica during the day with activities. Goal status: Met  7.  Patient's right hand dexterity and fine motor manipulating objects from palm to fingertips fingertips to palm increase with 1 to 2 seconds with less tightness and discomfort BASELINE: 5 objects or right hand 12.27 seconds and left 9 seconds Goal status  New   ASSESSMENT:  CLINICAL IMPRESSION: Patient was seen for occupational therapy for  R [facility] arthroplasty and  CTR by Dr Camella on 09/14/2023.  Patient did great progress in grip and prehension strength as well as wrist motion and thumb strength.   Patient returns today after not being seen for 2 to 3 months with reports of increased spasm and stiffness tightness in the fingertips especially with fine motor coordination.  Upon assessment patient doing really well with prehension strength and grip.  But individual tendon glides.  As well as fine motor coordination with manipulating objects from palm to fingertips and fingertips to palm as well as thumb on fingers and fingers and thumb.  Patient 12.27 seconds on 5 small objects manipulation in the right hand and 9 seconds on the left.  Patient was provided with a home program and can follow-up 1 more time with me in 2 weeks.    Patient can benefit from skilled OT services to decrease stiffness and tightness and increase fine motor and dexterity return to prior level of function.    PERFORMANCE DEFICITS: in functional skills including ADLs, IADLs, ROM, strength, pain, flexibility, decreased knowledge of use of DME, and UE functional use,   and psychosocial skills including environmental adaptation and routines and behaviors.   IMPAIRMENTS: are limiting patient from ADLs, IADLs, rest and sleep, play, leisure, and social participation.   COMORBIDITIES: has no other co-morbidities that affects occupational performance. Patient will benefit from skilled OT to address above impairments and improve overall function.  MODIFICATION OR ASSISTANCE TO COMPLETE EVALUATION: No modification of tasks or assist necessary to complete an evaluation.  OT OCCUPATIONAL PROFILE AND HISTORY: Problem focused assessment: Including review of records relating to presenting problem.  CLINICAL DECISION MAKING: LOW - limited treatment options, no task modification necessary  REHAB POTENTIAL: Good for goals  EVALUATION COMPLEXITY: Low   PLAN:  OT FREQUENCY: 2 visits  OT DURATION: 3 weeks  PLANNED INTERVENTIONS: 97168 OT Re-evaluation, 97535 self care/ADL training, 02889 therapeutic exercise, 97530 therapeutic activity,  97140 manual therapy, 97018 paraffin, 02960 fluidotherapy, 97034 contrast bath, 97763 Orthotic/Prosthetic subsequent, scar mobilization, passive range of motion, patient/family education, and DME and/or AE instructions    CONSULTED AND AGREED WITH PLAN OF CARE: Patient     Treyvon Blahut, OTR/L,CLT 03/04/2024, 9:26 PM

## 2024-03-06 DIAGNOSIS — Z853 Personal history of malignant neoplasm of breast: Secondary | ICD-10-CM | POA: Diagnosis not present

## 2024-03-07 DIAGNOSIS — N3281 Overactive bladder: Secondary | ICD-10-CM | POA: Diagnosis not present

## 2024-03-07 DIAGNOSIS — R399 Unspecified symptoms and signs involving the genitourinary system: Secondary | ICD-10-CM | POA: Diagnosis not present

## 2024-03-10 NOTE — Addendum Note (Signed)
 Addended by: SHERLON NO T on: 03/10/2024 10:16 AM   Modules accepted: Orders

## 2024-04-06 DIAGNOSIS — T8543XA Leakage of breast prosthesis and implant, initial encounter: Secondary | ICD-10-CM | POA: Diagnosis not present

## 2024-04-06 DIAGNOSIS — E119 Type 2 diabetes mellitus without complications: Secondary | ICD-10-CM | POA: Diagnosis not present

## 2024-04-06 DIAGNOSIS — Z853 Personal history of malignant neoplasm of breast: Secondary | ICD-10-CM | POA: Diagnosis not present

## 2024-04-06 DIAGNOSIS — Z87891 Personal history of nicotine dependence: Secondary | ICD-10-CM | POA: Diagnosis not present

## 2024-04-06 DIAGNOSIS — Z45811 Encounter for adjustment or removal of right breast implant: Secondary | ICD-10-CM | POA: Diagnosis not present

## 2024-04-06 DIAGNOSIS — F419 Anxiety disorder, unspecified: Secondary | ICD-10-CM | POA: Diagnosis not present

## 2024-04-06 DIAGNOSIS — E785 Hyperlipidemia, unspecified: Secondary | ICD-10-CM | POA: Diagnosis not present

## 2024-04-06 DIAGNOSIS — J449 Chronic obstructive pulmonary disease, unspecified: Secondary | ICD-10-CM | POA: Diagnosis not present

## 2024-04-06 DIAGNOSIS — J4489 Other specified chronic obstructive pulmonary disease: Secondary | ICD-10-CM | POA: Diagnosis not present

## 2024-04-06 DIAGNOSIS — Z6831 Body mass index (BMI) 31.0-31.9, adult: Secondary | ICD-10-CM | POA: Diagnosis not present

## 2024-04-06 DIAGNOSIS — Y812 Prosthetic and other implants, materials and accessory general- and plastic-surgery devices associated with adverse incidents: Secondary | ICD-10-CM | POA: Diagnosis not present

## 2024-04-06 DIAGNOSIS — K219 Gastro-esophageal reflux disease without esophagitis: Secondary | ICD-10-CM | POA: Diagnosis not present

## 2024-04-06 DIAGNOSIS — Z9889 Other specified postprocedural states: Secondary | ICD-10-CM | POA: Diagnosis not present

## 2024-04-06 DIAGNOSIS — Z45812 Encounter for adjustment or removal of left breast implant: Secondary | ICD-10-CM | POA: Diagnosis not present

## 2024-04-06 DIAGNOSIS — Z9049 Acquired absence of other specified parts of digestive tract: Secondary | ICD-10-CM | POA: Diagnosis not present

## 2024-04-06 DIAGNOSIS — E66811 Obesity, class 1: Secondary | ICD-10-CM | POA: Diagnosis not present

## 2024-04-06 DIAGNOSIS — G4730 Sleep apnea, unspecified: Secondary | ICD-10-CM | POA: Diagnosis not present

## 2024-04-06 DIAGNOSIS — Z9013 Acquired absence of bilateral breasts and nipples: Secondary | ICD-10-CM | POA: Diagnosis not present

## 2024-04-06 DIAGNOSIS — H409 Unspecified glaucoma: Secondary | ICD-10-CM | POA: Diagnosis not present

## 2024-04-06 DIAGNOSIS — F32A Depression, unspecified: Secondary | ICD-10-CM | POA: Diagnosis not present

## 2024-04-06 DIAGNOSIS — F17290 Nicotine dependence, other tobacco product, uncomplicated: Secondary | ICD-10-CM | POA: Diagnosis not present

## 2024-06-26 ENCOUNTER — Ambulatory Visit: Admitting: Physician Assistant

## 2024-07-03 ENCOUNTER — Ambulatory Visit: Admitting: Physician Assistant
